# Patient Record
Sex: Female | Born: 1995 | Race: Black or African American | Hispanic: No | Marital: Single | Smoking: Current every day smoker
Health system: Southern US, Community
[De-identification: ages and names within clinical notes are randomized; demographics above are authoritative.]

## PROBLEM LIST (undated history)

## (undated) DIAGNOSIS — K219 Gastro-esophageal reflux disease without esophagitis: Secondary | ICD-10-CM

## (undated) DIAGNOSIS — I959 Hypotension, unspecified: Secondary | ICD-10-CM

## (undated) DIAGNOSIS — N83209 Unspecified ovarian cyst, unspecified side: Secondary | ICD-10-CM

## (undated) HISTORY — DX: Hypotension, unspecified: I95.9

---
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
## 2013-05-30 ENCOUNTER — Ambulatory Visit: Payer: Self-pay | Admitting: Family Medicine

## 2013-06-10 ENCOUNTER — Emergency Department: Payer: Self-pay | Admitting: Emergency Medicine

## 2013-06-10 LAB — URINALYSIS, COMPLETE
BILIRUBIN, UR: NEGATIVE
Bacteria: NONE SEEN
Blood: NEGATIVE
Glucose,UR: NEGATIVE mg/dL (ref 0–75)
Ketone: NEGATIVE
Leukocyte Esterase: NEGATIVE
Nitrite: NEGATIVE
PROTEIN: NEGATIVE
Ph: 6 (ref 4.5–8.0)
RBC,UR: 1 /HPF (ref 0–5)
Specific Gravity: 1.025 (ref 1.003–1.030)

## 2014-09-08 ENCOUNTER — Emergency Department
Admission: EM | Admit: 2014-09-08 | Discharge: 2014-09-08 | Disposition: A | Discharge status: Home / Self Care | Payer: Self-pay | Attending: Emergency Medicine | Admitting: Emergency Medicine

## 2014-09-08 DIAGNOSIS — Z3202 Encounter for pregnancy test, result negative: Secondary | ICD-10-CM | POA: Insufficient documentation

## 2014-09-08 DIAGNOSIS — Z720 Tobacco use: Secondary | ICD-10-CM | POA: Insufficient documentation

## 2014-09-08 DIAGNOSIS — K2970 Gastritis, unspecified, without bleeding: Secondary | ICD-10-CM | POA: Insufficient documentation

## 2014-09-08 LAB — URINALYSIS COMPLETE WITH MICROSCOPIC (ARMC ONLY)
BILIRUBIN URINE: NEGATIVE
GLUCOSE, UA: NEGATIVE mg/dL
HGB URINE DIPSTICK: NEGATIVE
Ketones, ur: NEGATIVE mg/dL
LEUKOCYTES UA: NEGATIVE
Nitrite: NEGATIVE
PROTEIN: NEGATIVE mg/dL
RBC / HPF: NONE SEEN RBC/hpf (ref 0–5)
Specific Gravity, Urine: 1.017 (ref 1.005–1.030)
pH: 8 (ref 5.0–8.0)

## 2014-09-08 LAB — POCT PREGNANCY, URINE: Preg Test, Ur: NEGATIVE

## 2014-09-08 MED ORDER — ONDANSETRON 4 MG PO TBDP
ORAL_TABLET | ORAL | Status: AC
  Administered 2014-09-08: 8 mg via ORAL
  Filled 2014-09-08: qty 2

## 2014-09-08 MED ORDER — GI COCKTAIL ~~LOC~~
30.0000 mL | ORAL | Status: AC
  Administered 2014-09-08: 30 mL via ORAL

## 2014-09-08 MED ORDER — RANITIDINE HCL 150 MG PO CAPS: 150.0000 mg | ORAL_CAPSULE | Freq: Two times a day (BID) | ORAL | Status: DC

## 2014-09-08 MED ORDER — SUCRALFATE 1 G PO TABS: 1.0000 g | ORAL_TABLET | Freq: Four times a day (QID) | ORAL | Status: DC

## 2014-09-08 MED ORDER — ONDANSETRON 4 MG PO TBDP
8.0000 mg | ORAL_TABLET | Freq: Once | ORAL | Status: AC
  Administered 2014-09-08: 8 mg via ORAL

## 2014-09-08 MED ORDER — GI COCKTAIL ~~LOC~~
ORAL | Status: AC
  Administered 2014-09-08: 30 mL via ORAL
  Filled 2014-09-08: qty 30

## 2014-09-08 NOTE — Discharge Instructions (Signed)

## 2014-09-08 NOTE — ED Notes (Signed)
Pt alert and oriented X4, active, cooperative, pt in NAD. RR even and unlabored, color WNL.  Pt informed to return if any life threatening symptoms occur.   

## 2014-09-08 NOTE — ED Notes (Signed)
EDP aware that pt does not want blood drawn. States that we do not need blood.

## 2014-09-08 NOTE — ED Provider Notes (Signed)
[facility] Emergency Department Provider Note  ____________________________________________  Time seen: 2:15 PM  I have reviewed the triage vital signs and the nursing notes.   HISTORY  Chief Complaint Abdominal Pain    HPI Dana Logan is a 19 y.o. female who complains of upper abdominal pain since last night. It is waxing and waning, and she is tolerating fluids. However, she notes that when she tries to eat bacon she gets stomach upset and vomits. Pain started around 11 PM last night. She does report a history of acid reflux but does not take any medications currently. No frequency or dysuria urgency or hematuria. Patient also reports some diarrhea, and her son is been sick with a viral illness for about a week. The pain is sharp nonradiating no aggravating or alleviating factors. No chest pain shortness of breath fever chills or headache.     History reviewed. No pertinent past medical history.  There are no active problems to display for this patient.   History reviewed. No pertinent past surgical history.  Current Outpatient Rx  Name  Route  Sig  Dispense  Refill  . ranitidine (ZANTAC) 150 MG capsule   Oral   Take 1 capsule (150 mg total) by mouth 2 (two) times daily.   28 capsule   0   . sucralfate (CARAFATE) 1 G tablet   Oral   Take 1 tablet (1 g total) by mouth 4 (four) times daily.   120 tablet   1     Allergies Review of patient's allergies indicates no known allergies.  No family history on file.  Social History History  Substance Use Topics  . Smoking status: Current Every Day Smoker  . Smokeless tobacco: Not on file  . Alcohol Use: Yes    Review of Systems  Constitutional: No fever or chills. No weight changes Eyes:No blurry vision or double vision.  ENT: No sore throat. Cardiovascular: No chest pain. Respiratory: No dyspnea or cough. Gastrointestinal: Upper abdominal pain with vomiting and diarrhea.  No BRBPR  or melena. Genitourinary: Negative for dysuria, urinary retention, bloody urine, or difficulty urinating. Musculoskeletal: Negative for back pain. No joint swelling or pain. Skin: Negative for rash. Neurological: Negative for headaches, focal weakness or numbness. Psychiatric:No anxiety or depression.   Endocrine:No hot/cold intolerance, changes in energy, or sleep difficulty.  10-point ROS otherwise negative.  ____________________________________________   PHYSICAL EXAM:  VITAL SIGNS: ED Triage Vitals  Enc Vitals Group     BP 09/08/14 1243 103/72 mmHg     Pulse Rate 09/08/14 1243 98     Resp 09/08/14 1405 16     Temp 09/08/14 1243 98 F (36.7 C)     Temp Source 09/08/14 1243 Oral     SpO2 09/08/14 1243 100 %     Weight 09/08/14 1243 125 lb (56.7 kg)     Height 09/08/14 1243 5' (1.524 m)     Head Cir --      Peak Flow --      Pain Score 09/08/14 1255 4     Pain Loc --      Pain Edu? --      Excl. in GC? --      Constitutional: Alert and oriented. Well appearing and in no distress. Eyes: No scleral icterus. No conjunctival pallor. PERRL. EOMI ENT   Head: Normocephalic and atraumatic.   Nose: No congestion/rhinnorhea. No septal hematoma   Mouth/Throat: MMM, no pharyngeal erythema. No peritonsillar mass. No uvula shift.  Neck: No stridor. No SubQ emphysema. No meningismus. Hematological/Lymphatic/Immunilogical: No cervical lymphadenopathy. Cardiovascular: RRR. Normal and symmetric distal pulses are present in all extremities. No murmurs, rubs, or gallops. Respiratory: Normal respiratory effort without tachypnea nor retractions. Breath sounds are clear and equal bilaterally. No wheezes/rales/rhonchi. Gastrointestinal: Soft, mild epigastric tenderness. No right upper quadrant or left right lower quadrant tenderness. No distention. There is no CVA tenderness.  No rebound, rigidity, or guarding. Genitourinary: deferred Musculoskeletal: Nontender with normal  range of motion in all extremities. No joint effusions.  No lower extremity tenderness.  No edema. Neurologic:   Normal speech and language.  CN 2-10 normal. Motor grossly intact. No pronator drift.  Normal gait. No gross focal neurologic deficits are appreciated.  Skin:  Skin is warm, dry and intact. No rash noted.  No petechiae, purpura, or bullae. Psychiatric: Mood and affect are normal. Speech and behavior are normal. Patient exhibits appropriate insight and judgment.  ____________________________________________    LABS (pertinent positives/negatives) (all labs ordered are listed, but only abnormal results are displayed) Labs Reviewed  URINALYSIS COMPLETEWITH MICROSCOPIC ([facility] ONLY) - Abnormal; Notable for the following:    Color, Urine YELLOW (*)    APPearance CLEAR (*)    Bacteria, UA RARE (*)    Squamous Epithelial / LPF 0-5 (*)    All other components within normal limits  COMPREHENSIVE METABOLIC PANEL  CBC WITH DIFFERENTIAL/PLATELET  LIPASE, BLOOD  POC URINE PREG, ED  POCT PREGNANCY, URINE   patient refused blood draw. Urinalysis and pregnancy test negative and normal. ____________________________________________   EKG    ____________________________________________    RADIOLOGY    ____________________________________________   PROCEDURES  ____________________________________________   INITIAL IMPRESSION / ASSESSMENT AND PLAN / ED COURSE  Pertinent labs & imaging results that were available during my care of the patient were reviewed by me and considered in my medical decision making (see chart for details).  No evidence of dehydration. Triage blood pressure likely erroneous as the patient has strong pulses in all extremities and appears well hydrated and has no orthostatic symptoms. Symptoms consistent with either viral syndrome or gastritis due to acid reflux. We'll give her Zofran and a GI cocktail and assess her symptoms. If she is able to drink  fluids and keep herself hydrated we can discharge her home.  ----------------------------------------- 3:11 PM on 09/08/2014 -----------------------------------------  Patient feeling much better, back to normal, tolerating juice by mouth. We'll discharge her home with Carafate and Zantac  ____________________________________________   FINAL CLINICAL IMPRESSION(S) / ED DIAGNOSES  Final diagnoses:  Gastritis      Sharman CheekPhillip Santos Sollenberger, MD 09/08/14 1511

## 2014-09-08 NOTE — ED Notes (Addendum)
Pt refuses blood work, ,states that she "does not give blood". Pt also inquiring about ultrasound to look for IUD, concerned it may be IUD causing her problems. IUD in for 3 years. Pt c/o upper abdominal pain cramping in nature that began this AM after diarrhea. Denies vaginal bleeding. Pt alert and oriented X4, active, cooperative, pt in NAD. RR even and unlabored, color WNL.  POC pregnancy negative per triage nurse.

## 2014-09-08 NOTE — ED Notes (Addendum)
Patient c/o aching upper abdominal pain. Started this am. Also experiencing n/v/d. Patient states that she ate cream of mushroom soup last night that had been sitting out in the crockpot. No one else that consumed the soup is experiencing these symptoms.

## 2014-09-08 NOTE — ED Notes (Signed)
Patient refusing to have blood drawn at this time.

## 2016-01-18 ENCOUNTER — Emergency Department
Admission: EM | Admit: 2016-01-18 | Discharge: 2016-01-18 | Disposition: A | Discharge status: Home / Self Care | Payer: Self-pay | Attending: Emergency Medicine | Admitting: Emergency Medicine

## 2016-01-18 ENCOUNTER — Encounter: Payer: Self-pay | Admitting: Emergency Medicine

## 2016-01-18 DIAGNOSIS — L0231 Cutaneous abscess of buttock: Secondary | ICD-10-CM | POA: Insufficient documentation

## 2016-01-18 DIAGNOSIS — F17200 Nicotine dependence, unspecified, uncomplicated: Secondary | ICD-10-CM | POA: Insufficient documentation

## 2016-01-18 MED ORDER — SULFAMETHOXAZOLE-TRIMETHOPRIM 800-160 MG PO TABS: 1.0000 | ORAL_TABLET | Freq: Two times a day (BID) | ORAL | 0 refills | Status: DC

## 2016-01-18 MED ORDER — ACETAMINOPHEN 500 MG PO TABS
1000.0000 mg | ORAL_TABLET | Freq: Once | ORAL | Status: AC
  Administered 2016-01-18: 1000 mg via ORAL
  Filled 2016-01-18: qty 2

## 2016-01-18 MED ORDER — TRAMADOL HCL 50 MG PO TABS: 50.0000 mg | ORAL_TABLET | Freq: Four times a day (QID) | ORAL | 0 refills | Status: AC | PRN

## 2016-01-18 MED ORDER — IBUPROFEN 800 MG PO TABS
800.0000 mg | ORAL_TABLET | Freq: Once | ORAL | Status: AC
  Administered 2016-01-18: 800 mg via ORAL
  Filled 2016-01-18: qty 1

## 2016-01-18 MED ORDER — LIDOCAINE HCL (PF) 1 % IJ SOLN
INTRAMUSCULAR | Status: AC
  Filled 2016-01-18: qty 5

## 2016-01-18 NOTE — ED Triage Notes (Signed)
Pt to ed with c/o abscess to perineal area x 3 days.

## 2016-01-18 NOTE — ED Notes (Signed)
Patient presents to the ED with a perineal abscess.  Patient reports shaving perineum recently and noticed swelling approx 3 days ago but pain is worse this morning.  Patient states, "I get bumps and boils sometimes, but they usually go away quickly, it's never been this bad."

## 2016-01-18 NOTE — ED Provider Notes (Signed)
[facility] Emergency Department Provider Note  ____________________________________________   First MD Initiated Contact with Patient 01/18/16 1005     (approximate)  I have reviewed the triage vital signs and the nursing notes.   HISTORY  Chief Complaint Abscess   HPI Dana Logan is a 20 y.o. female who presents with abscess in the perianal region x3 days. Patient shaves and thought that it may have been an ingrown hair, but got concerned when she couldn't get it to resolve at home. Patient complains of 10/10 pain that is constant. Notes that area has not drained anything despite her trying to get it to "come to a head". Patient denies fevers, abdominal pain, diarrhea, dysuria, vaginal discharge or vaginal bleeding.    History reviewed. No pertinent past medical history.  There are no active problems to display for this patient.   History reviewed. No pertinent surgical history.  Prior to Admission medications   Medication Sig Start Date End Date Taking? Authorizing Provider  ranitidine (ZANTAC) 150 MG capsule Take 1 capsule (150 mg total) by mouth 2 (two) times daily. 09/08/14   Sharman CheekPhillip Stafford, MD  sucralfate (CARAFATE) 1 G tablet Take 1 tablet (1 g total) by mouth 4 (four) times daily. 09/08/14   Sharman CheekPhillip Stafford, MD  sulfamethoxazole-trimethoprim (BACTRIM DS,SEPTRA DS) 800-160 MG tablet Take 1 tablet by mouth 2 (two) times daily. 01/18/16   Charmayne Sheerharles M Beers, PA-C  traMADol (ULTRAM) 50 MG tablet Take 1 tablet (50 mg total) by mouth every 6 (six) hours as needed. 01/18/16 01/17/17  Evangeline Dakinharles M Beers, PA-C    Allergies Review of patient's allergies indicates no known allergies.  History reviewed. No pertinent family history.  Social History Social History  Substance Use Topics  . Smoking status: Current Every Day Smoker  . Smokeless tobacco: Never Used  . Alcohol use Yes    Review of Systems Constitutional: No  fever/chills Gastrointestinal: No abdominal pain.  No nausea, no vomiting.  No diarrhea.  No constipation. Genitourinary: Negative for dysuria, vaginal discharge, vaginal bleeding. Musculoskeletal: Negative for back pain or pain in hips. Skin: Positive for "boil" on left buttocks. Neurological: Negative for headaches, focal weakness or numbness.  ____________________________________________   PHYSICAL EXAM:  VITAL SIGNS: ED Triage Vitals  Enc Vitals Group     BP 01/18/16 0915 100/67     Pulse Rate 01/18/16 0915 (!) 113     Resp 01/18/16 0915 18     Temp 01/18/16 0915 97.5 F (36.4 C)     Temp Source 01/18/16 0915 Oral     SpO2 01/18/16 0915 100 %     Weight 01/18/16 0911 120 lb (54.4 kg)     Height 01/18/16 0911 5' (1.524 m)     Head Circumference --      Peak Flow --      Pain Score 01/18/16 0911 10     Pain Loc --      Pain Edu? --      Excl. in GC? --     Constitutional: Alert and oriented. Well appearing and in no acute distress. Eyes: Conjunctivae are normal.  Head: Atraumatic. Neck: No stridor. Supple, full ROM without pain or difficulty. Cardiovascular: Normal rate, regular rhythm. Grossly normal heart sounds.  Good peripheral circulation. Respiratory: Normal respiratory effort.  No retractions. Lungs CTAB. Gastrointestinal: Soft and nontender. No distention. Musculoskeletal: Full ROM in all extremities without pain or difficulty. Neurologic:  Normal speech and language. No gross focal neurologic deficits are appreciated.  Skin:  Skin is warm, dry and intact. 4x3 cm area of induration and mild erythema with central fluctuance noted to left buttocks.  Psychiatric: Mood and affect are normal. Speech and behavior are normal.  ____________________________________________   LABS (all labs ordered are listed, but only abnormal results are displayed)  Labs Reviewed - No data to  display ____________________________________________  EKG  None. ____________________________________________  RADIOLOGY  None. ____________________________________________   PROCEDURES  Procedure(s) performed:   Marland KitchenMarland KitchenIncision and Drainage Date/Time: 01/18/2016 11:19 AM Performed by: Evangeline Dakin Authorized by: Evangeline Dakin   Consent:    Consent obtained:  Verbal   Consent given by:  Patient   Risks discussed:  Bleeding, infection and pain   Alternatives discussed:  No treatment Location:    Type:  Abscess   Location:  Anogenital   Anogenital location:  Perianal Pre-procedure details:    Skin preparation:  Betadine Anesthesia (see MAR for exact dosages):    Anesthesia method:  Local infiltration   Local anesthetic:  Lidocaine 1% w/o epi Procedure type:    Complexity:  Simple Procedure details:    Incision types:  Single straight   Scalpel blade:  11   Wound management:  Probed and deloculated   Drainage:  Purulent and bloody   Drainage amount:  Copious   Wound treatment:  Wound left open   Packing materials:  1/4 in iodoform gauze   Amount 1/4" iodoform:  8 in  Post-procedure details:    Patient tolerance of procedure:  Tolerated well, no immediate complications    Critical Care performed: No  ____________________________________________   INITIAL IMPRESSION / ASSESSMENT AND PLAN / ED COURSE  Pertinent labs & imaging results that were available during my care of the patient were reviewed by me and considered in my medical decision making (see chart for details).  Patient presented with abscess to left buttocks. Abscess incised and drained as described above. Given prescriptions for PO tramadol 50 mg #8 tabs with 0 refills and bactrim. Patient instructed to follow up in 48 hours for wound recheck. Given instructions on wound care. No other emergency medicine complaints at this time.   Clinical Course      ____________________________________________   FINAL CLINICAL IMPRESSION(S) / ED DIAGNOSES  Final diagnoses:  Abscess of left buttock      NEW MEDICATIONS STARTED DURING THIS VISIT:  New Prescriptions   SULFAMETHOXAZOLE-TRIMETHOPRIM (BACTRIM DS,SEPTRA DS) 800-160 MG TABLET    Take 1 tablet by mouth 2 (two) times daily.   TRAMADOL (ULTRAM) 50 MG TABLET    Take 1 tablet (50 mg total) by mouth every 6 (six) hours as needed.     Note:  This document was prepared using Dragon voice recognition software and may include unintentional dictation errors.   Evangeline Dakin, PA-C 01/18/16 1416    Jeanmarie Plant, MD 01/18/16 [phone]

## 2016-01-19 ENCOUNTER — Emergency Department: Admission: EM | Admit: 2016-01-19 | Discharge: 2016-01-19 | Discharge status: Against Medical Advice | Payer: Self-pay

## 2016-01-20 ENCOUNTER — Emergency Department
Admission: EM | Admit: 2016-01-20 | Discharge: 2016-01-20 | Disposition: A | Discharge status: Home / Self Care | Payer: Self-pay | Attending: Student in an Organized Health Care Education/Training Program | Admitting: Student in an Organized Health Care Education/Training Program

## 2016-01-20 ENCOUNTER — Encounter: Payer: Self-pay | Admitting: Emergency Medicine

## 2016-01-20 DIAGNOSIS — Z5189 Encounter for other specified aftercare: Secondary | ICD-10-CM

## 2016-01-20 DIAGNOSIS — Z4801 Encounter for change or removal of surgical wound dressing: Secondary | ICD-10-CM | POA: Insufficient documentation

## 2016-01-20 DIAGNOSIS — Z79899 Other long term (current) drug therapy: Secondary | ICD-10-CM | POA: Insufficient documentation

## 2016-01-20 DIAGNOSIS — F17200 Nicotine dependence, unspecified, uncomplicated: Secondary | ICD-10-CM | POA: Insufficient documentation

## 2016-01-20 NOTE — ED Provider Notes (Signed)
[facility] Emergency Department Provider Note  ____________________________________________   First MD Initiated Contact with Patient 01/20/16 [phone]     (approximate)  I have reviewed the triage vital signs and the nursing notes.   HISTORY  Chief Complaint Wound Check    HPI Dana Logan is a 20 y.o. female is here for packing removal. Patient was seen on 01/18/16 where an abscess was drained. Patient continues taking antibodies without any difficulty. Patient denies any severe worsening of her symptoms and has no knowledge of fever. Pain at this time is 0/10.   History reviewed. No pertinent past medical history.  There are no active problems to display for this patient.   History reviewed. No pertinent surgical history.  Prior to Admission medications   Medication Sig Start Date End Date Taking? Authorizing Provider  ranitidine (ZANTAC) 150 MG capsule Take 1 capsule (150 mg total) by mouth 2 (two) times daily. 09/08/14   Sharman Cheek, MD  sucralfate (CARAFATE) 1 G tablet Take 1 tablet (1 g total) by mouth 4 (four) times daily. 09/08/14   Sharman Cheek, MD  sulfamethoxazole-trimethoprim (BACTRIM DS,SEPTRA DS) 800-160 MG tablet Take 1 tablet by mouth 2 (two) times daily. 01/18/16   Charmayne Sheer Beers, PA-C  traMADol (ULTRAM) 50 MG tablet Take 1 tablet (50 mg total) by mouth every 6 (six) hours as needed. 01/18/16 01/17/17  Evangeline Dakin, PA-C    Allergies Review of patient's allergies indicates no known allergies.  No family history on file.  Social History Social History  Substance Use Topics  . Smoking status: Current Every Day Smoker  . Smokeless tobacco: Never Used  . Alcohol use Yes    Review of Systems Constitutional: No fever/chills Cardiovascular: Denies chest pain. Respiratory: Denies shortness of breath. Gastrointestinal:   No nausea, no vomiting.   Skin: Positive abscess. Neurological: Negative for headaches, focal  weakness or numbness.  10-point ROS otherwise negative.  ____________________________________________   PHYSICAL EXAM:  VITAL SIGNS: ED Triage Vitals  Enc Vitals Group     BP 01/20/16 0828 108/64     Pulse Rate 01/20/16 0828 94     Resp 01/20/16 0828 16     Temp 01/20/16 0828 98.2 F (36.8 C)     Temp Source 01/20/16 0828 Oral     SpO2 01/20/16 0828 97 %     Weight 01/20/16 0824 120 lb (54.4 kg)     Height 01/20/16 0824 5' (1.524 m)     Head Circumference --      Peak Flow --      Pain Score 01/20/16 0824 0     Pain Loc --      Pain Edu? --      Excl. in GC? --     Constitutional: Alert and oriented. Well appearing and in no acute distress. Eyes: Conjunctivae are normal. PERRL. EOMI. Head: Atraumatic. Nose: No congestion/rhinnorhea. Neck: No stridor.   Cardiovascular: Normal rate, regular rhythm. Grossly normal heart sounds.  Good peripheral circulation. Respiratory: Normal respiratory effort.  No retractions. Lungs CTAB. Musculoskeletal: Moves upper and lower extremities without any difficulty. Normal gait was noted. Neurologic:  Normal speech and language. No gross focal neurologic deficits are appreciated. No gait instability. Skin:  Healing abscess noted left buttocks area. Minimal drainage present on dressing. Dressing was removed without any difficulty. Psychiatric: Mood and affect are normal. Speech and behavior are normal.  ____________________________________________   LABS (all labs ordered are listed, but only abnormal results are  displayed)  Labs Reviewed - No data to display  PROCEDURES  Procedure(s) performed: None  Procedures  Critical Care performed: No  ____________________________________________   INITIAL IMPRESSION / ASSESSMENT AND PLAN / ED COURSE  Pertinent labs & imaging results that were available during my care of the patient were reviewed by me and considered in my medical decision making (see chart for details).    Clinical  Course   Patient [facility] any continued problems.  ____________________________________________   FINAL CLINICAL IMPRESSION(S) / ED DIAGNOSES  Final diagnoses:  Wound check, abscess      NEW MEDICATIONS STARTED DURING THIS VISIT:  New Prescriptions   No medications on file     Note:  This document was prepared using Dragon voice recognition software and may include unintentional dictation errors.    Tommi Rumpshonda L Summers, PA-C 01/20/16 [phone]    Willy EddyPatrick Robinson, MD 01/20/16 [phone]

## 2016-01-20 NOTE — ED Notes (Signed)
Pt had I&D on abscess on Thursday. Pt was told to come to the ED to have the gauze removed from wound. Pt stating that it is draining "thick puss." Wound is on posterior portion or LLE. Near gluteus fold.

## 2016-01-20 NOTE — ED Notes (Signed)
Pt's wound with dressed. Pt given extra 4x4s.

## 2016-01-20 NOTE — ED Triage Notes (Signed)
I+D of right gluteal abscess on Thursday.  Here today for packing removal

## 2016-01-20 NOTE — Discharge Instructions (Signed)
Sitz bath or warm moist compresses frequently today. Finishing antibiotic. [facility] if any continued problems.

## 2016-03-31 ENCOUNTER — Emergency Department
Admission: EM | Admit: 2016-03-31 | Discharge: 2016-04-01 | Disposition: A | Discharge status: Home / Self Care | Payer: Self-pay | Attending: Emergency Medicine | Admitting: Emergency Medicine

## 2016-03-31 ENCOUNTER — Emergency Department: Payer: Self-pay

## 2016-03-31 ENCOUNTER — Encounter: Payer: Self-pay | Admitting: Emergency Medicine

## 2016-03-31 DIAGNOSIS — F17200 Nicotine dependence, unspecified, uncomplicated: Secondary | ICD-10-CM | POA: Insufficient documentation

## 2016-03-31 DIAGNOSIS — R1012 Left upper quadrant pain: Secondary | ICD-10-CM

## 2016-03-31 DIAGNOSIS — K529 Noninfective gastroenteritis and colitis, unspecified: Secondary | ICD-10-CM | POA: Insufficient documentation

## 2016-03-31 DIAGNOSIS — R112 Nausea with vomiting, unspecified: Secondary | ICD-10-CM

## 2016-03-31 DIAGNOSIS — Z79899 Other long term (current) drug therapy: Secondary | ICD-10-CM | POA: Insufficient documentation

## 2016-03-31 HISTORY — DX: Gastro-esophageal reflux disease without esophagitis: K21.9

## 2016-03-31 LAB — COMPREHENSIVE METABOLIC PANEL
ALBUMIN: 4.4 g/dL (ref 3.5–5.0)
ALK PHOS: 72 U/L (ref 38–126)
ALT: 11 U/L — AB (ref 14–54)
AST: 18 U/L (ref 15–41)
Anion gap: 10 (ref 5–15)
BUN: 12 mg/dL (ref 6–20)
CALCIUM: 9.8 mg/dL (ref 8.9–10.3)
CHLORIDE: 103 mmol/L (ref 101–111)
CO2: 24 mmol/L (ref 22–32)
CREATININE: 0.79 mg/dL (ref 0.44–1.00)
GFR calc non Af Amer: >60 mL/min (ref >60)
GLUCOSE: 111 mg/dL — AB (ref 65–99)
Potassium: 3.2 mmol/L — ABNORMAL LOW (ref 3.5–5.1)
Sodium: 137 mmol/L (ref 135–145)
Total Bilirubin: 1.1 mg/dL (ref 0.3–1.2)
Total Protein: 9.2 g/dL — ABNORMAL HIGH (ref 6.5–8.1)

## 2016-03-31 LAB — URINALYSIS, COMPLETE (UACMP) WITH MICROSCOPIC
BILIRUBIN URINE: NEGATIVE
Bacteria, UA: NONE SEEN
Glucose, UA: NEGATIVE mg/dL
HGB URINE DIPSTICK: NEGATIVE
Ketones, ur: 5 mg/dL — AB
NITRITE: NEGATIVE
PROTEIN: 100 mg/dL — AB
Specific Gravity, Urine: 1.033 — ABNORMAL HIGH (ref 1.005–1.030)
pH: 5 (ref 5.0–8.0)

## 2016-03-31 LAB — CBC WITH DIFFERENTIAL/PLATELET
BASOS ABS: 0 10*3/uL (ref 0–0.1)
BASOS PCT: 0 %
EOS ABS: 0 10*3/uL (ref 0–0.7)
EOS PCT: 0 %
HCT: 42.5 % (ref 35.0–47.0)
HEMOGLOBIN: 14.3 g/dL (ref 12.0–16.0)
Lymphocytes Relative: 9 %
Lymphs Abs: 1.6 10*3/uL (ref 1.0–3.6)
MCH: 29.8 pg (ref 26.0–34.0)
MCHC: 33.6 g/dL (ref 32.0–36.0)
MCV: 88.8 fL (ref 80.0–100.0)
Monocytes Absolute: 0.8 10*3/uL (ref 0.2–0.9)
Monocytes Relative: 4 %
NEUTROS PCT: 87 %
Neutro Abs: 16.1 10*3/uL — ABNORMAL HIGH (ref 1.4–6.5)
PLATELETS: 238 10*3/uL (ref 150–440)
RBC: 4.79 MIL/uL (ref 3.80–5.20)
RDW: 13.4 % (ref 11.5–14.5)
WBC: 18.6 10*3/uL — AB (ref 3.6–11.0)

## 2016-03-31 LAB — POCT PREGNANCY, URINE: PREG TEST UR: NEGATIVE

## 2016-03-31 LAB — LIPASE, BLOOD: Lipase: 12 U/L (ref 11–51)

## 2016-03-31 MED ORDER — KETOROLAC TROMETHAMINE 30 MG/ML IJ SOLN
INTRAMUSCULAR | Status: AC
  Administered 2016-03-31: 30 mg via INTRAVENOUS
  Filled 2016-03-31: qty 1

## 2016-03-31 MED ORDER — KETOROLAC TROMETHAMINE 30 MG/ML IJ SOLN
30.0000 mg | Freq: Once | INTRAMUSCULAR | Status: AC
  Administered 2016-03-31: 30 mg via INTRAVENOUS

## 2016-03-31 MED ORDER — IOPAMIDOL (ISOVUE-300) INJECTION 61%
75.0000 mL | Freq: Once | INTRAVENOUS | Status: AC | PRN
  Administered 2016-03-31: 75 mL via INTRAVENOUS
  Filled 2016-03-31: qty 75

## 2016-03-31 MED ORDER — IOPAMIDOL (ISOVUE-300) INJECTION 61%
15.0000 mL | INTRAVENOUS | Status: AC
  Administered 2016-03-31: 15 mL via ORAL
  Filled 2016-03-31 (×2): qty 15

## 2016-03-31 MED ORDER — ONDANSETRON HCL 4 MG/2ML IJ SOLN
INTRAMUSCULAR | Status: AC
  Filled 2016-03-31: qty 2

## 2016-03-31 MED ORDER — ONDANSETRON HCL 4 MG/2ML IJ SOLN
4.0000 mg | Freq: Once | INTRAMUSCULAR | Status: AC
  Administered 2016-03-31: 4 mg via INTRAVENOUS

## 2016-03-31 MED ORDER — SODIUM CHLORIDE 0.9 % IV BOLUS (SEPSIS)
1000.0000 mL | Freq: Once | INTRAVENOUS | Status: AC
  Administered 2016-03-31: 1000 mL via INTRAVENOUS

## 2016-03-31 NOTE — ED Triage Notes (Signed)
abd pain and vomiting since yesterday. States also hot and cold chills

## 2016-03-31 NOTE — ED Provider Notes (Signed)
[facility] Emergency Department Provider Note  ____________________________________________  Time seen: Approximately 8:57 PM  I have reviewed the triage vital signs and the nursing notes.   HISTORY  Chief Complaint Abdominal Pain   HPI Dana Logan is a 20 y.o. female history of GERD who presents for evaluation of epigastric abdominal pain and multiple episodes of nonbloody nonbilious emesis. Patient reports 2 days of constant sharp severe epigastric abdominal pain. The pain is nonradiating. She reports she has never had similar pain before. No prior abdominal surgeries. No diarrhea, no constipation, no dysuria, no hematuria, no fever or chills, no vaginal discharge. Patient thinks that the pain is from her IUD which has been placed 4 years ago. No chest pain or coughing, no shortness of breath. She denies melena, coffee-ground emesis, hematemesis, NSAID use. She reports that she drinks alcohol socially.  Past Medical History:  Diagnosis Date  . GERD (gastroesophageal reflux disease)     There are no active problems to display for this patient.   History reviewed. No pertinent surgical history.  Prior to Admission medications   Medication Sig Start Date End Date Taking? Authorizing Provider  ciprofloxacin (CIPRO) 500 MG tablet Take 1 tablet (500 mg total) by mouth 2 (two) times daily. 04/01/16   Irean HongJade J Sung, MD  dicyclomine (BENTYL) 20 MG tablet Take 1 tablet (20 mg total) by mouth every 6 (six) hours as needed. 04/01/16   Irean HongJade J Sung, MD  ondansetron (ZOFRAN ODT) 4 MG disintegrating tablet Take 1 tablet (4 mg total) by mouth every 8 (eight) hours as needed for nausea or vomiting. 04/01/16   Irean HongJade J Sung, MD  ranitidine (ZANTAC) 150 MG capsule Take 1 capsule (150 mg total) by mouth 2 (two) times daily. 09/08/14   Sharman CheekPhillip Stafford, MD  sucralfate (CARAFATE) 1 G tablet Take 1 tablet (1 g total) by mouth 4 (four) times daily. 09/08/14   Sharman CheekPhillip Stafford,  MD  sulfamethoxazole-trimethoprim (BACTRIM DS,SEPTRA DS) 800-160 MG tablet Take 1 tablet by mouth 2 (two) times daily. 01/18/16   Charmayne Sheerharles M Beers, PA-C  traMADol (ULTRAM) 50 MG tablet Take 1 tablet (50 mg total) by mouth every 6 (six) hours as needed. 01/18/16 01/17/17  Evangeline Dakinharles M Beers, PA-C    Allergies Patient has no known allergies.  History reviewed. No pertinent family history.  Social History Social History  Substance Use Topics  . Smoking status: Current Every Day Smoker  . Smokeless tobacco: Never Used  . Alcohol use Yes    Review of Systems  Constitutional: Negative for fever. Eyes: Negative for visual changes. ENT: Negative for sore throat. Neck: No neck pain  Cardiovascular: Negative for chest pain. Respiratory: Negative for shortness of breath. Gastrointestinal: +  abdominal pain, vomiting. No diarrhea. Genitourinary: Negative for dysuria. Musculoskeletal: Negative for back pain. Skin: Negative for rash. Neurological: Negative for headaches, weakness or numbness. Psych: No SI or HI  ____________________________________________   PHYSICAL EXAM:  VITAL SIGNS: ED Triage Vitals  Enc Vitals Group     BP 03/31/16 1955 (!) 115/53     Pulse Rate 03/31/16 1955 (!) 112     Resp 03/31/16 1955 16     Temp 03/31/16 1955 98.1 F (36.7 C)     Temp src --      SpO2 03/31/16 1955 100 %     Weight 03/31/16 1956 115 lb (52.2 kg)     Height 03/31/16 1956 5' (1.524 m)     Head Circumference --  Peak Flow --      Pain Score 03/31/16 1956 7     Pain Loc --      Pain Edu? --      Excl. in GC? --     Constitutional: Alert and oriented. Well appearing and in no apparent distress. HEENT:      Head: Normocephalic and atraumatic.         Eyes: Conjunctivae are normal. Sclera is non-icteric. EOMI. PERRL      Mouth/Throat: Mucous membranes are moist.       Neck: Supple with no signs of meningismus. Cardiovascular: Regular rate and rhythm. No murmurs, gallops, or rubs. 2+  symmetrical distal pulses are present in all extremities. No JVD. Respiratory: Normal respiratory effort. Lungs are clear to auscultation bilaterally. No wheezes, crackles, or rhonchi.  Gastrointestinal: Soft, ttp over the epigastric and LUQ, and non distended with positive bowel sounds. No rebound or guarding. Genitourinary: No CVA tenderness. Musculoskeletal: Nontender with normal range of motion in all extremities. No edema, cyanosis, or erythema of extremities. Neurologic: Normal speech and language. Face is symmetric. Moving all extremities. No gross focal neurologic deficits are appreciated. Skin: Skin is warm, dry and intact. No rash noted. Psychiatric: Mood and affect are normal. Speech and behavior are normal.  ____________________________________________   LABS (all labs ordered are listed, but only abnormal results are displayed)  Labs Reviewed  CBC WITH DIFFERENTIAL/PLATELET - Abnormal; Notable for the following:       Result Value   WBC 18.6 (*)    Neutro Abs 16.1 (*)    All other components within normal limits  COMPREHENSIVE METABOLIC PANEL - Abnormal; Notable for the following:    Potassium 3.2 (*)    Glucose, Bld 111 (*)    Total Protein 9.2 (*)    ALT 11 (*)    All other components within normal limits  URINALYSIS, COMPLETE (UACMP) WITH MICROSCOPIC - Abnormal; Notable for the following:    Color, Urine AMBER (*)    APPearance CLEAR (*)    Specific Gravity, Urine 1.033 (*)    Ketones, ur 5 (*)    Protein, ur 100 (*)    Leukocytes, UA SMALL (*)    Squamous Epithelial / LPF 0-5 (*)    All other components within normal limits  LIPASE, BLOOD  POCT PREGNANCY, URINE   ____________________________________________  EKG  none  ____________________________________________  RADIOLOGY  CT a/p: PND ____________________________________________   PROCEDURES  Procedure(s) performed: None Procedures Critical Care performed:   None ____________________________________________   INITIAL IMPRESSION / ASSESSMENT AND PLAN / ED COURSE  20 y.o. female history of GERD who presents for evaluation of epigastric abdominal pain and multiple episodes of nonbloody nonbilious emesis. Patient is well-appearing, in no distress, she is tachycardic but otherwise has normal vital signs, patient is tender to palpation in the epigastric and left upper quadrants. Her blood work shows normal lipase, normal CMP, leukocytosis with white count of 18.6. CT abdomen and pelvis is pending. Urinalysis with no evidence of urinary tract infection. Pregnancy is negative. Care transferred to dr. Dolores Frame at 11PM with CT pending  Clinical Course     Pertinent labs & imaging results that were available during my care of the patient were reviewed by me and considered in my medical decision making (see chart for details).    ____________________________________________   FINAL CLINICAL IMPRESSION(S) / ED DIAGNOSES  Final diagnoses:  Left upper quadrant pain  Non-intractable vomiting with nausea, unspecified vomiting type  Enterocolitis  NEW MEDICATIONS STARTED DURING THIS VISIT:  Discharge Medication List as of 04/01/2016 12:36 AM    START taking these medications   Details  ciprofloxacin (CIPRO) 500 MG tablet Take 1 tablet (500 mg total) by mouth 2 (two) times daily., Starting Mon 04/01/2016, Print    dicyclomine (BENTYL) 20 MG tablet Take 1 tablet (20 mg total) by mouth every 6 (six) hours as needed., Starting Mon 04/01/2016, Print    ondansetron (ZOFRAN ODT) 4 MG disintegrating tablet Take 1 tablet (4 mg total) by mouth every 8 (eight) hours as needed for nausea or vomiting., Starting Mon 04/01/2016, Print         Note:  This document was prepared using Dragon voice recognition software and may include unintentional dictation errors.    Nita Sicklearolina Leanna Hamid, MD 04/01/16 [phone]

## 2016-03-31 NOTE — ED Notes (Signed)
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 Declined having her blood drawn in triage. Pt alert, moist mucous membranes, in nad.

## 2016-03-31 NOTE — ED Notes (Signed)
See triage note..the patient c/o abd pain and vomiting.  Sts that pain worse mid abd, pt expresses concern that it is her IUD.  Pt denies fever, CP, SOB, dizziness or LOC.

## 2016-04-01 MED ORDER — CIPROFLOXACIN HCL 500 MG PO TABS
500.0000 mg | ORAL_TABLET | Freq: Once | ORAL | Status: AC
  Administered 2016-04-01: 500 mg via ORAL
  Filled 2016-04-01: qty 1

## 2016-04-01 MED ORDER — CIPROFLOXACIN HCL 500 MG PO TABS: 500.0000 mg | ORAL_TABLET | Freq: Two times a day (BID) | ORAL | 0 refills | Status: DC

## 2016-04-01 MED ORDER — DICYCLOMINE HCL 20 MG PO TABS: 20.0000 mg | ORAL_TABLET | Freq: Four times a day (QID) | ORAL | 0 refills | Status: DC | PRN

## 2016-04-01 MED ORDER — ONDANSETRON 4 MG PO TBDP: 4.0000 mg | ORAL_TABLET | Freq: Three times a day (TID) | ORAL | 0 refills | Status: DC | PRN

## 2016-04-01 NOTE — Discharge Instructions (Signed)
1. You may take medicines as needed for abdominal cramping and nausea (Bentyl/Zofran #20). 2. Take antibiotic as prescribed (Cipro 500 mg twice daily 5 days). 3. Clear liquids 12 hours, then BRAT diet 3 days, then slowly advance diet as tolerated. 4. Return to the ER for worsening symptoms, persistent vomiting, difficulty breathing or other concerns.

## 2016-04-01 NOTE — ED Provider Notes (Signed)
-----------------------------------------   12:29 AM on 04/01/2016 -----------------------------------------  CT abdomen and pelvis interpreted per Dr. Gwenyth Benderadparvar: Findings most compatible with enterocolitis. Correlation with  clinical exam and stool cultures recommended. No bowel obstruction.  Normal appendix.   Patient is feeling much better, walking around. Tolerated PO without emesis. Discussed with patient results of CT scan. Will place her on Cipro, Zofran and Bentyl as needed, and she will follow-up with her PCP next week. Strict return precautions given. Patient verbalizes understanding and agrees with plan of care.   Irean HongJade J Sung, MD 04/01/16 [phone]

## 2017-08-19 ENCOUNTER — Emergency Department
Admission: EM | Admit: 2017-08-19 | Discharge: 2017-08-19 | Disposition: A | Discharge status: Home / Self Care | Payer: Self-pay | Attending: Emergency Medicine | Admitting: Emergency Medicine

## 2017-08-19 ENCOUNTER — Other Ambulatory Visit: Payer: Self-pay

## 2017-08-19 ENCOUNTER — Encounter: Payer: Self-pay | Admitting: Emergency Medicine

## 2017-08-19 DIAGNOSIS — L03116 Cellulitis of left lower limb: Secondary | ICD-10-CM | POA: Insufficient documentation

## 2017-08-19 DIAGNOSIS — F17200 Nicotine dependence, unspecified, uncomplicated: Secondary | ICD-10-CM | POA: Insufficient documentation

## 2017-08-19 LAB — POCT PREGNANCY, URINE: Preg Test, Ur: NEGATIVE

## 2017-08-19 MED ORDER — SULFAMETHOXAZOLE-TRIMETHOPRIM 800-160 MG PO TABS: 1.0000 | ORAL_TABLET | Freq: Two times a day (BID) | ORAL | 0 refills | Status: DC

## 2017-08-19 NOTE — Discharge Instructions (Addendum)
Follow-up with [facility] acute care or urgent care of your choice if any continued problems.  Begin using Bactrim DS twice daily for the next 10 days and warm moist compresses to your foot.  Elevate if needed for swelling.  Tylenol or ibuprofen as needed for pain.  You may also use Benadryl 1 or 2 capsules every 6 hours as needed for itching.

## 2017-08-19 NOTE — ED Triage Notes (Signed)
Pt arrives POV with c/o insect bite located on left foot. Pt noticed it last night. Pt states pain, burning and itching. Redness and slight swelling noted to top of foot. Pt does state allergy to mosquito bites.

## 2017-08-19 NOTE — ED Notes (Signed)
Pt has redness and swelling noted to top of left foot. Pt states pain with touch and that it hurts to put her shoes on.

## 2017-08-19 NOTE — ED Provider Notes (Signed)
[facility] Emergency Department Provider Note  ____________________________________________   First MD Initiated Contact with Patient 08/19/17 1432     (approximate)  I have reviewed the triage vital signs and the nursing notes.   HISTORY  Chief Complaint Insect Bite   HPI Dana Logan is a 22 y.o. female is here with complaint of a insect bite to her left foot that occurred last evening.  Patient states is very painful day with burning and itching.  She also noticed that there was some swelling with warmth to touch.  Patient has been unaware of any fever and denies chills.  Patient did not see what bit her and states that she is allergic to mosquito bites.  She rates her pain as 6 out of 10.   Past Medical History:  Diagnosis Date  . GERD (gastroesophageal reflux disease)     There are no active problems to display for this patient.   History reviewed. No pertinent surgical history.  Prior to Admission medications   Medication Sig Start Date End Date Taking? Authorizing Provider  ranitidine (ZANTAC) 150 MG capsule Take 1 capsule (150 mg total) by mouth 2 (two) times daily. 09/08/14   Sharman Cheek, MD  sulfamethoxazole-trimethoprim (BACTRIM DS,SEPTRA DS) 800-160 MG tablet Take 1 tablet by mouth 2 (two) times daily. 08/19/17   Tommi Rumps, PA-C    Allergies Patient has no known allergies.  No family history on file.  Social History Social History   Tobacco Use  . Smoking status: Current Every Day Smoker  . Smokeless tobacco: Never Used  Substance Use Topics  . Alcohol use: Yes  . Drug use: Not on file    Review of Systems Constitutional: No fever/chills Cardiovascular: Denies chest pain. Respiratory: Denies shortness of breath. Musculoskeletal: Left foot pain. Skin: Positive for erythema and pain. Neurological: Negative for headaches, focal weakness or numbness. ___________________________________________   PHYSICAL  EXAM:  VITAL SIGNS: ED Triage Vitals  Enc Vitals Group     BP 08/19/17 1415 (!) 91/49     Pulse Rate 08/19/17 1415 96     Resp 08/19/17 1415 18     Temp 08/19/17 1415 98.6 F (37 C)     Temp Source 08/19/17 1415 Oral     SpO2 08/19/17 1415 99 %     Weight 08/19/17 1416 130 lb (59 kg)     Height 08/19/17 1416  (1.448 m)     Head Circumference --      Peak Flow --      Pain Score 08/19/17 1415 6     Pain Loc --      Pain Edu? --      Excl. in GC? --    Constitutional: Alert and oriented. Well appearing and in no acute distress. Eyes: Conjunctivae are normal.  Head: Atraumatic. Neck: No stridor.   Cardiovascular: Normal rate, regular rhythm. Grossly normal heart sounds.  Good peripheral circulation. Respiratory: Normal respiratory effort.  No retractions. Lungs CTAB. Musculoskeletal: Examination of left foot dorsal aspect there is some soft tissue edema and warmth to touch.  No vesicles or open skin is noted.  No drainage present.  Motor sensory function intact distal to her insect bite. Neurologic:  Normal speech and language. No gross focal neurologic deficits are appreciated.  Skin:  Skin is warm, dry.  As noted above. Psychiatric: Mood and affect are normal. Speech and behavior are normal.  ____________________________________________   LABS (all labs ordered are listed, but  only abnormal results are displayed)  Labs Reviewed  POC URINE PREG, ED  POCT PREGNANCY, URINE     PROCEDURES  Procedure(s) performed: None  Procedures  Critical Care performed: No  ____________________________________________   INITIAL IMPRESSION / ASSESSMENT AND PLAN / ED COURSE Patient is here with complaint of pain and itching to her left foot after being bitten by a insect last evening.  Area is consistent with a cellulitis.  Patient was placed on Bactrim DS twice daily for 10 days after her urinalysis showed negative pregnancy.  Patient currently has a [employer] fast streamer.  She will use warm  compresses to the area and follow-up with her PCP or urgent care if any continued problems.  ____________________________________________   FINAL CLINICAL IMPRESSION(S) / ED DIAGNOSES  Final diagnoses:  Cellulitis of left foot     ED Discharge Orders        Ordered    sulfamethoxazole-trimethoprim (BACTRIM DS,SEPTRA DS) 800-160 MG tablet  2 times daily     08/19/17 1507       Note:  This document was prepared using Dragon voice recognition software and may include unintentional dictation errors.    Tommi Rumps, PA-C 08/19/17 1512    Jeanmarie Plant, MD 08/19/17 [phone]

## 2017-08-20 ENCOUNTER — Emergency Department
Admission: EM | Admit: 2017-08-20 | Discharge: 2017-08-20 | Disposition: A | Discharge status: Home / Self Care | Payer: Self-pay | Attending: Emergency Medicine | Admitting: Emergency Medicine

## 2017-08-20 ENCOUNTER — Other Ambulatory Visit: Payer: Self-pay

## 2017-08-20 ENCOUNTER — Encounter: Payer: Self-pay | Admitting: Emergency Medicine

## 2017-08-20 DIAGNOSIS — F17200 Nicotine dependence, unspecified, uncomplicated: Secondary | ICD-10-CM | POA: Insufficient documentation

## 2017-08-20 DIAGNOSIS — M79672 Pain in left foot: Secondary | ICD-10-CM | POA: Insufficient documentation

## 2017-08-20 DIAGNOSIS — Z79899 Other long term (current) drug therapy: Secondary | ICD-10-CM | POA: Insufficient documentation

## 2017-08-20 NOTE — ED Provider Notes (Signed)
[facility] Emergency Department Provider Note  ____________________________________________  Time seen: Approximately 8:30 AM  I have reviewed the triage vital signs and the nursing notes.   HISTORY  Chief Complaint Foot Pain   HPI Dana Logan is a 22 y.o. female who presents to the emergency department for treatment and evaluation of pain and swelling of the left foot. She was evaluated yesterday and states that the swelling has gone down, but she is unable to get her foot in her shoe and couldn't go to work today. She states she doesn't think that we need to change her medications, but just need a note for work.  Past Medical History:  Diagnosis Date  . GERD (gastroesophageal reflux disease)     There are no active problems to display for this patient.   History reviewed. No pertinent surgical history.  Prior to Admission medications   Medication Sig Start Date End Date Taking? Authorizing Provider  ranitidine (ZANTAC) 150 MG capsule Take 1 capsule (150 mg total) by mouth 2 (two) times daily. 09/08/14   Sharman Cheek, MD  sulfamethoxazole-trimethoprim (BACTRIM DS,SEPTRA DS) 800-160 MG tablet Take 1 tablet by mouth 2 (two) times daily. 08/19/17   Tommi Rumps, PA-C    Allergies Patient has no known allergies.  No family history on file.  Social History Social History   Tobacco Use  . Smoking status: Current Every Day Smoker  . Smokeless tobacco: Never Used  Substance Use Topics  . Alcohol use: Yes  . Drug use: Not on file    Review of Systems Constitutional: Negative for fever. ENT: Negative for sore throat. Respiratory: Negative for cough Gastrointestinal: No abdominal pain.  No nausea, no vomiting.  No diarrhea.  Musculoskeletal: Negative for generalized body aches. Skin: Negative for rash/lesion/wound.  Positive for mild erythema of the left foot Neurological: Negative for headaches, focal weakness or  numbness.  ____________________________________________   PHYSICAL EXAM:  VITAL SIGNS: ED Triage Vitals  Enc Vitals Group     BP 08/20/17 0808 113/66     Pulse Rate 08/20/17 0808 76     Resp 08/20/17 0808 16     Temp 08/20/17 0808 97.8 F (36.6 C)     Temp Source 08/20/17 0808 Oral     SpO2 08/20/17 0808 100 %     Weight 08/20/17 0809 130 lb (59 kg)     Height 08/20/17 0809 5' (1.524 m)     Head Circumference --      Peak Flow --      Pain Score 08/20/17 0808 7     Pain Loc --      Pain Edu? --      Excl. in GC? --     Constitutional: Alert and oriented. Well appearing and in no acute distress. Eyes: Conjunctivae are normal. PERRL. EOMI. Head: Atraumatic. Nose: No congestion/rhinnorhea. Mouth/Throat: Mucous membranes are moist. Neck: No stridor.  Cardiovascular: Normal rate, regular rhythm. Good peripheral circulation. Respiratory: Normal respiratory effort. Musculoskeletal: Full ROM throughout.  Neurologic:  Normal speech and language. No gross focal neurologic deficits are appreciated. Speech is normal. No gait instability. Skin:  Skin is warm, dry and intact. No rash noted. Psychiatric: Mood and affect are normal. Speech and behavior are normal.  ____________________________________________   LABS (all labs ordered are listed, but only abnormal results are displayed)  Labs Reviewed - No data to display ____________________________________________  EKG  Not indicated ____________________________________________  RADIOLOGY  Not indicated ____________________________________________   PROCEDURES  None ____________________________________________   INITIAL IMPRESSION / ASSESSMENT AND PLAN / ED COURSE     Pertinent labs & imaging results that were available during my care of the patient were reviewed by me and considered in my medical decision making (see chart for details).  Patient was advised to follow-up with her primary care provider for  symptoms that are not improving over the next few days.  She was advised to continue taking her Bactrim.  Work excuse was provided for 2 days.  She was encouraged to return to the emergency department if unable to schedule an appointment with her primary care provider and the symptoms are worsening.. ____________________________________________   FINAL CLINICAL IMPRESSION(S) / ED DIAGNOSES  Final diagnoses:  Foot pain, left       Chinita Pester, FNP 08/20/17 1552    Emily Filbert, MD 08/21/17 1114

## 2017-08-20 NOTE — ED Triage Notes (Signed)
Patient here complaining of increased swelling and pain in left foot.  Seen here yesterday and diagnosed with cellulitis after insect bite.  Returns this AM requesting extended work note.  Left foot reddened and swollen.  Wearing flip flops.

## 2017-08-20 NOTE — ED Notes (Signed)
See triage note  States she was seen yesterday and dx;d with cellulitis  Was placed on septra ds  States some of the swelling is gone  Area to lateral foot is red and painful  Ambulates with slight limp d/t pain

## 2017-10-08 ENCOUNTER — Other Ambulatory Visit: Payer: Self-pay

## 2017-10-08 ENCOUNTER — Emergency Department
Admission: EM | Admit: 2017-10-08 | Discharge: 2017-10-08 | Disposition: A | Discharge status: Home / Self Care | Payer: Self-pay | Attending: Emergency Medicine | Admitting: Emergency Medicine

## 2017-10-08 ENCOUNTER — Emergency Department: Payer: Self-pay

## 2017-10-08 ENCOUNTER — Encounter: Payer: Self-pay | Admitting: Emergency Medicine

## 2017-10-08 DIAGNOSIS — S20311A Abrasion of right front wall of thorax, initial encounter: Secondary | ICD-10-CM | POA: Insufficient documentation

## 2017-10-08 DIAGNOSIS — Y999 Unspecified external cause status: Secondary | ICD-10-CM | POA: Insufficient documentation

## 2017-10-08 DIAGNOSIS — F17210 Nicotine dependence, cigarettes, uncomplicated: Secondary | ICD-10-CM | POA: Insufficient documentation

## 2017-10-08 DIAGNOSIS — Y92511 Restaurant or cafe as the place of occurrence of the external cause: Secondary | ICD-10-CM | POA: Insufficient documentation

## 2017-10-08 DIAGNOSIS — Y9389 Activity, other specified: Secondary | ICD-10-CM | POA: Insufficient documentation

## 2017-10-08 DIAGNOSIS — W3400XA Accidental discharge from unspecified firearms or gun, initial encounter: Secondary | ICD-10-CM | POA: Insufficient documentation

## 2017-10-08 DIAGNOSIS — T148XXA Other injury of unspecified body region, initial encounter: Secondary | ICD-10-CM

## 2017-10-08 DIAGNOSIS — Z79899 Other long term (current) drug therapy: Secondary | ICD-10-CM | POA: Insufficient documentation
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
## 2017-10-08 NOTE — Discharge Instructions (Addendum)
Return to the ER if you have chest pain, shortness of breath, dizziness, abdominal pain, or any new symptoms concerning to you. Otherwise follow up with your doctor in a few days. Keep the abrasion area dry and clean, wash with warm water and soap.
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
## 2018-05-27 ENCOUNTER — Other Ambulatory Visit: Payer: Self-pay

## 2018-05-27 ENCOUNTER — Emergency Department
Admission: EM | Admit: 2018-05-27 | Discharge: 2018-05-27 | Disposition: A | Discharge status: Home / Self Care | Payer: Self-pay | Attending: Emergency Medicine | Admitting: Emergency Medicine

## 2018-05-27 ENCOUNTER — Encounter: Payer: Self-pay | Admitting: Emergency Medicine

## 2018-05-27 DIAGNOSIS — N760 Acute vaginitis: Secondary | ICD-10-CM | POA: Insufficient documentation

## 2018-05-27 DIAGNOSIS — F17210 Nicotine dependence, cigarettes, uncomplicated: Secondary | ICD-10-CM | POA: Insufficient documentation

## 2018-05-27 DIAGNOSIS — B9689 Other specified bacterial agents as the cause of diseases classified elsewhere: Secondary | ICD-10-CM | POA: Insufficient documentation

## 2018-05-27 LAB — URINALYSIS, COMPLETE (UACMP) WITH MICROSCOPIC
BILIRUBIN URINE: NEGATIVE
Glucose, UA: NEGATIVE mg/dL
Hgb urine dipstick: NEGATIVE
KETONES UR: 5 mg/dL — AB
Nitrite: NEGATIVE
PROTEIN: NEGATIVE mg/dL
Specific Gravity, Urine: 1.021 (ref 1.005–1.030)
pH: 6 (ref 5.0–8.0)

## 2018-05-27 LAB — WET PREP, GENITAL
Sperm: NONE SEEN
TRICH WET PREP: NONE SEEN

## 2018-05-27 MED ORDER — METRONIDAZOLE 500 MG PO TABS: 500.0000 mg | ORAL_TABLET | Freq: Two times a day (BID) | ORAL | 0 refills | Status: DC

## 2018-05-27 MED ORDER — SODIUM CHLORIDE 0.9% FLUSH: 3.0000 mL | Freq: Once | INTRAVENOUS | Status: DC

## 2018-05-27 NOTE — ED Notes (Signed)
Pt refused blood work  

## 2018-05-27 NOTE — ED Notes (Signed)
Pt states she is not interested in being treated for the abdominal pain, only wants to know if she has "a bad yeast infection."

## 2018-05-27 NOTE — Discharge Instructions (Signed)
Follow-up with your primary care provider or the health department.  Their contact information is listed on your discharge papers.  Begin taking Flagyl 500 mg until finished.  Again do not drink alcohol or take cough medications with alcohol in it while taking this medication.

## 2018-05-27 NOTE — ED Provider Notes (Signed)
[facility] Emergency Department Provider Note  ____________________________________________   First MD Initiated Contact with Patient 05/27/18 1328     (approximate)  I have reviewed the triage vital signs and the nursing notes.   HISTORY  Chief Complaint Abdominal Pain and Vaginal Discharge   HPI Dana Logan is a 23 y.o. female presents to the ED with concerns about a possible yeast infection.  Patient states that she was on a antibiotic related to an ovarian cyst and thinks now that she has a yeast infection from this.   Rates her discomfort as a 9 out of 10.   Past Medical History:  Diagnosis Date  . GERD (gastroesophageal reflux disease)     There are no active problems to display for this patient.   History reviewed. No pertinent surgical history.  Prior to Admission medications   Medication Sig Start Date End Date Taking? Authorizing Provider  metroNIDAZOLE (FLAGYL) 500 MG tablet Take 1 tablet (500 mg total) by mouth 2 (two) times daily. 05/27/18   Tommi Rumps, PA-C  ranitidine (ZANTAC) 150 MG capsule Take 1 capsule (150 mg total) by mouth 2 (two) times daily. 09/08/14   Sharman Cheek, MD    Allergies Patient has no known allergies.  History reviewed. No pertinent family history.  Social History Social History   Tobacco Use  . Smoking status: Current Every Day Smoker    Packs/day: 0.50    Types: Cigarettes  . Smokeless tobacco: Never Used  Substance Use Topics  . Alcohol use: Yes    Alcohol/week: 2.0 standard drinks    Types: 2 Shots of liquor per week    Comment: 2 glasses during the week, more on the weekends  . Drug use: Yes    Types: Marijuana    Comment: daily    Review of Systems Constitutional: No fever/chills Eyes: No visual changes. ENT: No sore throat. Cardiovascular: Denies chest pain. Gastrointestinal: No abdominal pain.  No nausea, no vomiting.  Genitourinary: Negative for dysuria.  Positive  for vaginal itching. Musculoskeletal: Negative for back pain. Skin: Negative for rash. Neurological: Negative for headaches, focal weakness or numbness. ____________________________________________   PHYSICAL EXAM:  VITAL SIGNS: ED Triage Vitals  Enc Vitals Group     BP 05/27/18 1202 (!) 94/52     Pulse Rate 05/27/18 1202 88     Resp 05/27/18 1202 18     Temp 05/27/18 1202 98.1 F (36.7 C)     Temp Source 05/27/18 1202 Oral     SpO2 05/27/18 1202 100 %     Weight 05/27/18 1203 120 lb (54.4 kg)     Height 05/27/18 1203 5' (1.524 m)     Head Circumference --      Peak Flow --      Pain Score 05/27/18 1203 9     Pain Loc --      Pain Edu? --      Excl. in GC? --    Constitutional: Alert and oriented. Well appearing and in no acute distress. Eyes: Conjunctivae are normal.  Head: Atraumatic. Neck: No stridor.   Cardiovascular: Normal rate, regular rhythm. Grossly normal heart sounds.  Good peripheral circulation. Respiratory: Normal respiratory effort.  No retractions. Lungs CTAB. Gastrointestinal: Soft and nontender. No distention.  Genitourinary: Yellow vaginal discharge is present with an odor.  No cervical motion tenderness was appreciated however patient was uncomfortable. Musculoskeletal: No lower extremity tenderness nor edema.  No joint effusions. Neurologic:  Normal speech and  language. No gross focal neurologic deficits are appreciated. No gait instability. Skin:  Skin is warm, dry and intact.  Psychiatric: Mood and affect are normal. Speech and behavior are normal.  ____________________________________________   LABS (all labs ordered are listed, but only abnormal results are displayed)  Labs Reviewed  WET PREP, GENITAL - Abnormal; Notable for the following components:      Result Value   Yeast Wet Prep HPF POC PRESENT (*)    Clue Cells Wet Prep HPF POC PRESENT (*)    WBC, Wet Prep HPF POC MANY (*)    All other components within normal limits  URINALYSIS,  COMPLETE (UACMP) WITH MICROSCOPIC - Abnormal; Notable for the following components:   Color, Urine YELLOW (*)    APPearance HAZY (*)    Ketones, ur 5 (*)    Leukocytes,Ua LARGE (*)    Bacteria, UA RARE (*)    All other components within normal limits  POC URINE PREG, ED    PROCEDURES  Procedure(s) performed: None  Procedures  Critical Care performed: No  ____________________________________________   INITIAL IMPRESSION / ASSESSMENT AND PLAN / ED COURSE  As part of my medical decision making, I reviewed the following data within the electronic MEDICAL RECORD NUMBER Notes from prior ED visits and [place] Controlled Substance Database  Patient presents to the ED with vaginal itching and is concerned that she has a yeast infection because she finished taking an antibiotic.  She states that she was seen by a doctor yesterday but apparently did not stay for the results.  Wet prep did show clue cells and patient was made aware that this is a bacterial vaginosis.  Patient was given a prescription for Flagyl 500 mg twice daily until finished and given information about the health department to follow-up with.  ____________________________________________   FINAL CLINICAL IMPRESSION(S) / ED DIAGNOSES  Final diagnoses:  BV (bacterial vaginosis)     ED Discharge Orders         Ordered    metroNIDAZOLE (FLAGYL) 500 MG tablet  2 times daily     05/27/18 1517           Note:  This document was prepared using Dragon voice recognition software and may include unintentional dictation errors.    Tommi RumpsSummers, Hakeen Shipes L, PA-C 05/27/18 1525    Minna AntisPaduchowski, Kevin, MD 05/27/18 1536

## 2018-05-27 NOTE — ED Triage Notes (Signed)
Pt presents via pov from home with increased vaginal discharge and abdominal pain. States that she recently finished course of medication for inflammation related to ovarian cyst and thinks the yeast infection is from antibiotics. Pt alert & oriented with NAD noted.

## 2018-08-17 ENCOUNTER — Other Ambulatory Visit: Payer: Self-pay

## 2018-08-17 ENCOUNTER — Emergency Department
Admission: EM | Admit: 2018-08-17 | Discharge: 2018-08-18 | Disposition: A | Discharge status: Home / Self Care | Payer: Self-pay | Attending: Emergency Medicine | Admitting: Emergency Medicine

## 2018-08-17 ENCOUNTER — Encounter: Payer: Self-pay | Admitting: Emergency Medicine

## 2018-08-17 DIAGNOSIS — R109 Unspecified abdominal pain: Secondary | ICD-10-CM | POA: Insufficient documentation

## 2018-08-17 DIAGNOSIS — Z5321 Procedure and treatment not carried out due to patient leaving prior to being seen by health care provider: Secondary | ICD-10-CM | POA: Insufficient documentation

## 2018-08-17 DIAGNOSIS — R1110 Vomiting, unspecified: Secondary | ICD-10-CM | POA: Insufficient documentation

## 2018-08-17 HISTORY — DX: Unspecified ovarian cyst, unspecified side: N83.209

## 2018-08-17 MED ORDER — ONDANSETRON 4 MG PO TBDP
4.0000 mg | ORAL_TABLET | Freq: Once | ORAL | Status: AC | PRN
  Administered 2018-08-17: 4 mg via ORAL
  Filled 2018-08-17: qty 1

## 2018-08-17 NOTE — ED Notes (Signed)
No answer when called several times from lobby 

## 2018-08-17 NOTE — ED Notes (Signed)
Pt noted leaving ED lobby 

## 2018-08-17 NOTE — ED Triage Notes (Signed)
Patient reports abdominal pain with nausea and vomiting x4 hours. Patient denies fever. Reports cold chills. Patient refusing blood work in triage.

## 2018-08-18 NOTE — ED Notes (Signed)
No answer when called several times from lobby 

## 2019-07-14 ENCOUNTER — Encounter: Payer: Self-pay | Admitting: Certified Nurse Midwife

## 2019-10-14 ENCOUNTER — Emergency Department: Payer: Self-pay

## 2019-10-14 ENCOUNTER — Other Ambulatory Visit: Payer: Self-pay

## 2019-10-14 ENCOUNTER — Encounter: Payer: Self-pay | Admitting: Emergency Medicine

## 2019-10-14 ENCOUNTER — Emergency Department
Admission: EM | Admit: 2019-10-14 | Discharge: 2019-10-14 | Disposition: A | Discharge status: Home / Self Care | Payer: Self-pay | Attending: Emergency Medicine | Admitting: Emergency Medicine

## 2019-10-14 DIAGNOSIS — F17210 Nicotine dependence, cigarettes, uncomplicated: Secondary | ICD-10-CM | POA: Insufficient documentation

## 2019-10-14 DIAGNOSIS — Y939 Activity, unspecified: Secondary | ICD-10-CM | POA: Insufficient documentation

## 2019-10-14 DIAGNOSIS — Y999 Unspecified external cause status: Secondary | ICD-10-CM | POA: Insufficient documentation

## 2019-10-14 DIAGNOSIS — Y9289 Other specified places as the place of occurrence of the external cause: Secondary | ICD-10-CM | POA: Insufficient documentation

## 2019-10-14 DIAGNOSIS — M79632 Pain in left forearm: Secondary | ICD-10-CM | POA: Insufficient documentation

## 2019-10-14 MED ORDER — BACLOFEN 5 MG PO TABS
5.0000 mg | ORAL_TABLET | Freq: Three times a day (TID) | ORAL | 0 refills | Status: DC | PRN
Start: 2019-10-14 — End: 2020-04-24

## 2019-10-14 NOTE — ED Notes (Signed)
Pt states that she needs to leave now to take her son to an appointment. This RN tells her we're waiting on her CT scans but that I would let [place], [place] know.

## 2019-10-14 NOTE — ED Triage Notes (Signed)
Pt states that her left arm hurts worse. Pt with abrasion to back. No LOC
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
## 2019-10-14 NOTE — Discharge Instructions (Signed)
Your head CT is normal.  Your neck CT shows that you are likely having some muscle spasms to your neck.  Your forearm x-ray is negative for a fracture but you may have some injury to the tendons in the ligaments.  Please follow-up with orthopedics for reevaluation.
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
## 2019-11-03 ENCOUNTER — Emergency Department
Admission: EM | Admit: 2019-11-03 | Discharge: 2019-11-03 | Disposition: A | Discharge status: Home / Self Care | Payer: Medicaid - Dental | Attending: Emergency Medicine | Admitting: Emergency Medicine

## 2019-11-03 ENCOUNTER — Other Ambulatory Visit: Payer: Self-pay

## 2019-11-03 DIAGNOSIS — S5012XD Contusion of left forearm, subsequent encounter: Secondary | ICD-10-CM | POA: Insufficient documentation

## 2019-11-03 DIAGNOSIS — Y9389 Activity, other specified: Secondary | ICD-10-CM | POA: Insufficient documentation

## 2019-11-03 DIAGNOSIS — Y999 Unspecified external cause status: Secondary | ICD-10-CM | POA: Insufficient documentation

## 2019-11-03 DIAGNOSIS — F17210 Nicotine dependence, cigarettes, uncomplicated: Secondary | ICD-10-CM | POA: Insufficient documentation

## 2019-11-03 DIAGNOSIS — Y9289 Other specified places as the place of occurrence of the external cause: Secondary | ICD-10-CM | POA: Insufficient documentation

## 2019-11-03 NOTE — Discharge Instructions (Signed)
Follow discharge care instruction were explained as needed.
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
## 2019-11-03 NOTE — ED Notes (Signed)
See triage note  Presents with con'd pain to left forearm  Stats she was wearing a sling and did have some relief  Dog ate the sling  denies any new injury

## 2019-11-03 NOTE — ED Triage Notes (Addendum)
Pt c/o of L arm pain. States was given a brace but dog ate the brace. States has not been to work since Monday d/t pain. States wearing the brace helps her pain but since she hasn't had the brace the pain has increased. Wants a new brace today. A&O, ambulatory.   Also c/o L ear pain. State "I don't know if I can't hear because of the drill at work but I would like my ear checked."

## 2019-11-12 ENCOUNTER — Emergency Department
Admission: EM | Admit: 2019-11-12 | Discharge: 2019-11-12 | Disposition: A | Discharge status: Home / Self Care | Payer: Medicaid - Dental | Attending: Emergency Medicine | Admitting: Emergency Medicine

## 2019-11-12 ENCOUNTER — Other Ambulatory Visit: Payer: Self-pay

## 2019-11-12 DIAGNOSIS — R42 Dizziness and giddiness: Secondary | ICD-10-CM | POA: Insufficient documentation

## 2019-11-12 DIAGNOSIS — F17210 Nicotine dependence, cigarettes, uncomplicated: Secondary | ICD-10-CM | POA: Insufficient documentation

## 2019-11-12 MED ORDER — SODIUM CHLORIDE 0.9% FLUSH: 3.0000 mL | Freq: Once | INTRAVENOUS | Status: DC

## 2019-11-12 NOTE — ED Provider Notes (Signed)
[facility] Emergency Department Provider Note  ____________________________________________  Time seen: Approximately 3:39 PM  I have reviewed the triage vital signs and the nursing notes.   HISTORY  Chief Complaint Weakness and Hypotension    HPI Dana Logan is a 24 y.o. female that presents to emergency department for evaluation of an episode of lightheadedness while at work today.  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  She splashed some water on her face and ate something and felt immediately better.  She did go to see the nurse and was told that her blood pressure was low and that she required a note to return to work.  Patient states that she does occasionally feel lightheaded when she gets overheated at work.  She had not had any breakfast this morning.  She also states that she did not have any water this morning.  She limits her salt intake because her family has a history of high blood pressure and she is worried that she might develop this.  She feels better now.  She does not want any lab work.   Past Medical History:  Diagnosis Date   GERD (gastroesophageal reflux disease)    Ovarian cyst     There are no problems to display for this patient.   History reviewed. No pertinent surgical history.  Prior to Admission medications   Medication Sig Start Date End Date Taking? Authorizing Provider  Baclofen 5 MG TABS Take 5 mg by mouth 3 (three) times daily as needed. 10/14/19   Enid Derry, PA-C    Allergies Patient has no known allergies.  History reviewed. No pertinent family history.  Social History Social History   Tobacco Use   Smoking status: Current Every Day Smoker    Packs/day: 0.50    Types: Cigarettes   Smokeless tobacco: Never Used  Substance Use Topics   Alcohol use: Yes    Alcohol/week: 2.0 standard drinks    Types: 2 Shots of  liquor per week    Comment: 2 glasses during the week, more on the weekends   Drug use: Yes    Types: Marijuana    Comment: daily     Review of Systems  Constitutional: No fever/chills ENT: No upper respiratory complaints. Cardiovascular: No chest pain. Respiratory: No cough. No SOB. Gastrointestinal: No abdominal pain.  No nausea, no vomiting.  Musculoskeletal: Negative for musculoskeletal pain. Skin: Negative for rash, abrasions, lacerations, ecchymosis. Neurological: Negative for headaches, numbness or tingling   ____________________________________________   PHYSICAL EXAM:  VITAL SIGNS: ED Triage Vitals  Enc Vitals Group     BP 11/12/19 1333 (!) 106/60     Pulse Rate 11/12/19 1333 80     Resp 11/12/19 1333 18     Temp 11/12/19 1333 98.1 F (36.7 C)     Temp Source 11/12/19 1333 Oral     SpO2 11/12/19 1333 100 %     Weight 11/12/19 1334 125 lb (56.7 kg)     Height 11/12/19 1334 4\' 9"  (1.448 m)     Head Circumference --      Peak Flow --      Pain Score 11/12/19 1334 0     Pain Loc --      Pain Edu? --      Excl. in GC? --      Constitutional: Alert and oriented. Well appearing and in no acute  distress. Eyes: Conjunctivae are normal. PERRL. EOMI. Head: Atraumatic. ENT:      Ears:      Nose: No congestion/rhinnorhea.      Mouth/Throat: Mucous membranes are moist.  Neck: No stridor.   Cardiovascular: Normal rate, regular rhythm.  Good peripheral circulation. Respiratory: Normal respiratory effort without tachypnea or retractions. Lungs CTAB. Good air entry to the bases with no decreased or absent breath sounds. Gastrointestinal: Bowel sounds 4 quadrants. Soft and nontender to palpation. No guarding or rigidity. No palpable masses. No distention. Musculoskeletal: Full range of motion to all extremities. No gross deformities appreciated. Neurologic:  Normal speech and language. No gross focal neurologic deficits are appreciated.  Skin:  Skin is warm, dry and  intact. No rash noted. Psychiatric: Mood and affect are normal. Speech and behavior are normal. Patient exhibits appropriate insight and judgement.   ____________________________________________   LABS (all labs ordered are listed, but only abnormal results are displayed)  Labs Reviewed  BASIC METABOLIC PANEL  CBC  URINALYSIS, COMPLETE (UACMP) WITH MICROSCOPIC  CBG MONITORING, ED  POC URINE PREG, ED   ____________________________________________  EKG   ____________________________________________  RADIOLOGY   No results found.  ____________________________________________    PROCEDURES  Procedure(s) performed:    Procedures    Medications  sodium chloride flush (NS) 0.9 % injection 3 mL (3 mLs Intravenous Not Given 11/12/19 1411)     ____________________________________________   INITIAL IMPRESSION / ASSESSMENT AND PLAN / ED COURSE  Pertinent labs & imaging results that were available during my care of the patient were reviewed by me and considered in my medical decision making (see chart for details).  Review of the [place] CSRS was performed in accordance of the [facility] prior to dispensing any controlled drugs.     Patient presents to emergency department for evaluation of episode of lightheadedness today.  Vital signs and exam are reassuring.  Patient's blood pressure is 106/60 on arriva; in the emergency department.  This is similar to her previous blood pressures during her previous ER visits.  Patient was encouraged to drink plenty of fluids including Gatorade while she is at work, as it is a hot July day. Patient declines labwork or IV fluids.  Patient feels back to normal now.  She will request a note to return to work.  Patient is to follow up with primary care as directed. Patient is given ED precautions to return to the ED for any worsening or new symptoms.   Dana Logan was evaluated in Emergency Department on 11/12/2019 for the symptoms described  in the history of present illness. She was evaluated in the context of the global COVID-19 pandemic, which necessitated consideration that the patient might be at risk for infection with the SARS-CoV-2 virus that causes COVID-19. Institutional protocols and algorithms that pertain to the evaluation of patients at risk for COVID-19 are in a state of rapid change based on information released by regulatory bodies including the CDC and federal and state organizations. These policies and algorithms were followed during the patient's care in the ED.  ____________________________________________  FINAL CLINICAL IMPRESSION(S) / ED DIAGNOSES  Final diagnoses:  Light headedness      NEW MEDICATIONS STARTED DURING THIS VISIT:  ED Discharge Orders    None          This chart was dictated using voice recognition software/Dragon. Despite best efforts to proofread, errors can occur which can change the meaning. Any change was purely unintentional.    Enid Derry,  PA-C 11/12/19 2303    Gilles Chiquito, MD 11/13/19 [phone]

## 2019-11-12 NOTE — ED Triage Notes (Signed)
Pt arrives via POV from work for reports of low BP. Pt reports she was at work at [employer] and started to feel dizzy and faint, was told her BP was low and to come to the ER. Pt ambulatory from triage with steady gait, pt states she ate something and has cooled off and feels much better. Skin warm and dry

## 2019-11-12 NOTE — ED Notes (Signed)
Pt refuses blood work, pt states "I feel much better".

## 2019-11-12 NOTE — ED Notes (Signed)
Patient states her job sent her home for blood pressure in the 90's and dizziness and cold chills. Patient states she needs a work note to go back.

## 2020-02-09 ENCOUNTER — Other Ambulatory Visit: Payer: Self-pay

## 2020-02-09 ENCOUNTER — Emergency Department
Admission: EM | Admit: 2020-02-09 | Discharge: 2020-02-09 | Disposition: A | Discharge status: Home / Self Care | Payer: Self-pay | Attending: Student in an Organized Health Care Education/Training Program | Admitting: Student in an Organized Health Care Education/Training Program

## 2020-02-09 ENCOUNTER — Encounter: Payer: Self-pay | Admitting: Emergency Medicine

## 2020-02-09 DIAGNOSIS — R109 Unspecified abdominal pain: Secondary | ICD-10-CM | POA: Insufficient documentation

## 2020-02-09 DIAGNOSIS — R112 Nausea with vomiting, unspecified: Secondary | ICD-10-CM | POA: Insufficient documentation

## 2020-02-09 DIAGNOSIS — Z5321 Procedure and treatment not carried out due to patient leaving prior to being seen by health care provider: Secondary | ICD-10-CM | POA: Insufficient documentation

## 2020-02-09 LAB — URINALYSIS, COMPLETE (UACMP) WITH MICROSCOPIC
Bacteria, UA: NONE SEEN
Bilirubin Urine: NEGATIVE
Glucose, UA: NEGATIVE mg/dL
Hgb urine dipstick: NEGATIVE
Ketones, ur: NEGATIVE mg/dL
Leukocytes,Ua: NEGATIVE
Nitrite: NEGATIVE
Protein, ur: NEGATIVE mg/dL
Specific Gravity, Urine: 1.023 (ref 1.005–1.030)
pH: 7 (ref 5.0–8.0)

## 2020-02-09 NOTE — ED Notes (Signed)
Pt refusing bloodwork at this time

## 2020-02-09 NOTE — ED Triage Notes (Signed)
Pt comes into the ED via POV c/o abdominal pain, N/V.  Pt states her biggest concern is that she wants to make sure she isn't pregnant.  Pt was vomiting at work and her job sent her here.  They completed a rapid COVID and it was negative.  Pt in NAD at this time with even and unlabored respirations.

## 2020-02-10 ENCOUNTER — Encounter: Payer: Self-pay | Admitting: Emergency Medicine

## 2020-02-10 ENCOUNTER — Emergency Department
Admission: EM | Admit: 2020-02-10 | Discharge: 2020-02-10 | Disposition: A | Discharge status: Home / Self Care | Payer: Self-pay | Attending: Emergency Medicine | Admitting: Emergency Medicine

## 2020-02-10 ENCOUNTER — Other Ambulatory Visit: Payer: Self-pay

## 2020-02-10 DIAGNOSIS — R1030 Lower abdominal pain, unspecified: Secondary | ICD-10-CM | POA: Insufficient documentation

## 2020-02-10 DIAGNOSIS — R42 Dizziness and giddiness: Secondary | ICD-10-CM | POA: Insufficient documentation

## 2020-02-10 DIAGNOSIS — K219 Gastro-esophageal reflux disease without esophagitis: Secondary | ICD-10-CM | POA: Insufficient documentation

## 2020-02-10 DIAGNOSIS — F17210 Nicotine dependence, cigarettes, uncomplicated: Secondary | ICD-10-CM | POA: Insufficient documentation

## 2020-02-10 DIAGNOSIS — R112 Nausea with vomiting, unspecified: Secondary | ICD-10-CM | POA: Insufficient documentation

## 2020-02-10 DIAGNOSIS — R55 Syncope and collapse: Secondary | ICD-10-CM | POA: Insufficient documentation

## 2020-02-10 LAB — URINALYSIS, COMPLETE (UACMP) WITH MICROSCOPIC
Bacteria, UA: NONE SEEN
Bilirubin Urine: NEGATIVE
Glucose, UA: NEGATIVE mg/dL
Hgb urine dipstick: NEGATIVE
Ketones, ur: NEGATIVE mg/dL
Leukocytes,Ua: NEGATIVE
Nitrite: NEGATIVE
Protein, ur: NEGATIVE mg/dL
Specific Gravity, Urine: 1.018 (ref 1.005–1.030)
pH: 6 (ref 5.0–8.0)

## 2020-02-10 LAB — LIPASE, BLOOD: Lipase: 30 U/L (ref 11–51)

## 2020-02-10 LAB — POC URINE PREG, ED: Preg Test, Ur: NEGATIVE

## 2020-02-10 LAB — CBC
HCT: 40.2 % (ref 36.0–46.0)
Hemoglobin: 13.6 g/dL (ref 12.0–15.0)
MCH: 29.6 pg (ref 26.0–34.0)
MCHC: 33.8 g/dL (ref 30.0–36.0)
MCV: 87.6 fL (ref 80.0–100.0)
Platelets: 309 10*3/uL (ref 150–400)
RBC: 4.59 MIL/uL (ref 3.87–5.11)
RDW: 12.3 % (ref 11.5–15.5)
WBC: 7.8 10*3/uL (ref 4.0–10.5)
nRBC: 0 % (ref 0.0–0.2)

## 2020-02-10 LAB — COMPREHENSIVE METABOLIC PANEL
ALT: 20 U/L (ref 0–44)
AST: 23 U/L (ref 15–41)
Albumin: 4.2 g/dL (ref 3.5–5.0)
Alkaline Phosphatase: 67 U/L (ref 38–126)
Anion gap: 9 (ref 5–15)
BUN: 10 mg/dL (ref 6–20)
CO2: 24 mmol/L (ref 22–32)
Calcium: 9.5 mg/dL (ref 8.9–10.3)
Chloride: 104 mmol/L (ref 98–111)
Creatinine, Ser: 0.74 mg/dL (ref 0.44–1.00)
GFR, Estimated: >60 mL/min (ref >60)
Glucose, Bld: 85 mg/dL (ref 70–99)
Potassium: 4.2 mmol/L (ref 3.5–5.1)
Sodium: 137 mmol/L (ref 135–145)
Total Bilirubin: 1 mg/dL (ref 0.3–1.2)
Total Protein: 8.2 g/dL — ABNORMAL HIGH (ref 6.5–8.1)

## 2020-02-10 LAB — TSH: TSH: 0.856 u[IU]/mL (ref 0.350–4.500)

## 2020-02-10 NOTE — Discharge Instructions (Signed)
Follow-up with your OB/GYN and primary doctors.  Return to the ER for new, worsening, or persistent severe abdominal pain, vomiting, fever, weakness, feeling like you are going to pass out, or any other new or worsening symptoms that concern you.

## 2020-02-10 NOTE — ED Provider Notes (Signed)
[facility] Emergency Department Provider Note ____________________________________________   First MD Initiated Contact with Patient 02/10/20 1218     (approximate)  I have reviewed the triage vital signs and the nursing notes.   HISTORY  Chief Complaint Abdominal Pain    HPI Dana Logan is a 24 y.o. female with PMH as noted below who presents with 2 primary complaints.  She has had nausea and vomiting since yesterday, somewhat subsided today, associated with midline lower abdominal pain which has been constant.  She denies associated diarrhea, fever or chills, urinary symptoms, or any new vaginal bleeding.  She has a Mirena IUD and reports intermittent spotting, but this is chronic.  She states that her son had similar symptoms yesterday and had to go home from school.  In addition, the patient reports chronic lightheadedness and near syncope, with a few episodes of full syncope over the last several months.  This mainly occurs when she is at work.  She syncopized yesterday after vomiting.  She reports a prodrome of lightheadedness and feeling sweaty.  She denies chest pain, palpitations, or shortness of breath.  She states that she has been drinking plenty of fluids, but usually does not eat breakfast or eat anything while she is at work.   Past Medical History:  Diagnosis Date  . GERD (gastroesophageal reflux disease)   . Ovarian cyst     There are no problems to display for this patient.   History reviewed. No pertinent surgical history.  Prior to Admission medications   Medication Sig Start Date End Date Taking? Authorizing Provider  Baclofen 5 MG TABS Take 5 mg by mouth 3 (three) times daily as needed. 10/14/19   Enid Derry, PA-C    Allergies Patient has no known allergies.  History reviewed. No pertinent family history.  Social History Social History   Tobacco Use  . Smoking status: Current Every Day Smoker    Packs/day: 0.50      Types: Cigarettes  . Smokeless tobacco: Never Used  Substance Use Topics  . Alcohol use: Yes    Alcohol/week: 2.0 standard drinks    Types: 2 Shots of liquor per week    Comment: 2 glasses during the week, more on the weekends  . Drug use: Yes    Types: Marijuana    Comment: daily    Review of Systems  Constitutional: No fever/chills. Eyes: No redness. ENT: No sore throat. Cardiovascular: Denies chest pain. Respiratory: Denies shortness of breath. Gastrointestinal: Positive for nausea and vomiting. Genitourinary: Negative for dysuria.  Musculoskeletal: Negative for back pain. Skin: Negative for rash. Neurological: Negative for headache.   ____________________________________________   PHYSICAL EXAM:  VITAL SIGNS: ED Triage Vitals  Enc Vitals Group     BP 02/10/20 1147 (!) 108/54     Pulse Rate 02/10/20 1147 90     Resp 02/10/20 1147 16     Temp 02/10/20 1147 98.7 F (37.1 C)     Temp Source 02/10/20 1147 Oral     SpO2 02/10/20 1147 100 %     Weight 02/10/20 1145 125 lb (56.7 kg)     Height 02/10/20 1145 4\' 9"  (1.448 m)     Head Circumference --      Peak Flow --      Pain Score 02/10/20 1145 6     Pain Loc --      Pain Edu? --      Excl. in GC? --     Constitutional:  Alert and oriented. Well appearing and in no acute distress. Eyes: Conjunctivae are normal.  Head: Atraumatic. Nose: No congestion/rhinnorhea. Mouth/Throat: Mucous membranes are moist.   Neck: Normal range of motion.  Cardiovascular: Normal rate, regular rhythm.   Good peripheral circulation. Respiratory: Normal respiratory effort.  No retractions. Gastrointestinal: Soft and nontender. No distention.  Genitourinary: No flank tenderness. Musculoskeletal: Extremities warm and well perfused.  Neurologic:  Normal speech and language. No gross focal neurologic deficits are appreciated.  Skin:  Skin is warm and dry. No rash noted. Psychiatric: Mood and affect are normal. Speech and behavior  are normal.  ____________________________________________   LABS (all labs ordered are listed, but only abnormal results are displayed)  Labs Reviewed  COMPREHENSIVE METABOLIC PANEL - Abnormal; Notable for the following components:      Result Value   Total Protein 8.2 (*)    All other components within normal limits  URINALYSIS, COMPLETE (UACMP) WITH MICROSCOPIC - Abnormal; Notable for the following components:   Color, Urine YELLOW (*)    APPearance HAZY (*)    All other components within normal limits  LIPASE, BLOOD  CBC  TSH  POC URINE PREG, ED   ____________________________________________  EKG  ED ECG REPORT I, Dionne Bucy, the attending physician, personally viewed and interpreted this ECG.  Date: 02/10/2020 EKG Time: 1243 Rate: 69 Rhythm: normal sinus rhythm QRS Axis: normal Intervals: normal ST/T Wave abnormalities: normal Narrative Interpretation: no evidence of acute ischemia  ____________________________________________  RADIOLOGY    ____________________________________________   PROCEDURES  Procedure(s) performed: No  Procedures  Critical Care performed: No ____________________________________________   INITIAL IMPRESSION / ASSESSMENT AND PLAN / ED COURSE  Pertinent labs & imaging results that were available during my care of the patient were reviewed by me and considered in my medical decision making (see chart for details).  24 year old female with PMH as noted above presents with nausea and vomiting since yesterday associated with some suprapubic area pain.  Her son had similar symptoms.  In addition, she has had intermittent lightheadedness and near syncope frequently over the last few months, mainly while at work, and associated with a syncopal episode yesterday.  She had a prodrome of lightheadedness.  On exam, the patient is well-appearing.  Her vital signs are normal.  The abdomen is soft with no focal tenderness.    I  reviewed the past medical records in Epic.  The patient was seen in the ED on 7/30 with an episode of lightheadedness that resolved after eating and drinking.  She declined work-up at that time.  The patient reports a strong phobia of needles and blood draws, and initially declined blood work.  However, I explained that would be beneficial particularly in terms of working up her lightheadedness which she is concerned about, so she agreed.  In terms of the nausea, vomiting, abdominal pain, this is most consistent with an acute gastroenteritis or foodborne illness especially given that her son had the same symptoms.  This appears to be improving and she is not actively vomiting now.  It is also possible that she could have some chronic etiology of pelvic pain such as endometriosis.  There is no evidence of ovarian cyst rupture, torsion, or other gynecologic emergency.  The abdomen is soft and nontender.  Differential for the lightheadedness and near syncope includes dehydration, anemia, electrolyte abnormalities, renal insufficiency, hyperglycemia/poor p.o. intake, or other benign etiology.  I have a low suspicion for cardiac cause.  We will obtain basic and hepatobiliary labs,  urinalysis, TSH, EKG, and reassess.  ----------------------------------------- 1:49 PM on 02/10/2020 -----------------------------------------  Lab work-up is all within normal limits.  The patient continues to appear comfortable with no significant active pain.  She is stable for discharge at this time.  She states she feels well to go home.  I counseled her on the results of the work-up.  Return precautions given, and she expresses understanding.  ____________________________________________   FINAL CLINICAL IMPRESSION(S) / ED DIAGNOSES  Final diagnoses:  Syncope, unspecified syncope type  Lower abdominal pain      NEW MEDICATIONS STARTED DURING THIS VISIT:  New Prescriptions   No medications on file      Note:  This document was prepared using Dragon voice recognition software and may include unintentional dictation errors.   Dionne Bucy, MD 02/10/20 1350

## 2020-02-10 NOTE — ED Notes (Signed)
Pt assessed resting in bed, no distress noted, Dr Marisa Severin at bedside, call light in reach.

## 2020-02-10 NOTE — ED Triage Notes (Signed)
Pt comes into the ED via POV c/o abd pain and wants to r/o pregnancy.  Pt was seen yesterday but left prior to being seen by provider.  Pt refuses any blood work to be completed at this time.  Pt still admits to having N/V. Pt has had merena in for 8 years and is concerned she may be pregnant since she has had it in longer than the 5 year recommendation.

## 2020-03-03 ENCOUNTER — Other Ambulatory Visit: Payer: Self-pay

## 2020-03-03 ENCOUNTER — Emergency Department
Admission: EM | Admit: 2020-03-03 | Discharge: 2020-03-03 | Disposition: A | Discharge status: Home / Self Care | Payer: Self-pay | Attending: Student in an Organized Health Care Education/Training Program | Admitting: Student in an Organized Health Care Education/Training Program

## 2020-03-03 DIAGNOSIS — F17210 Nicotine dependence, cigarettes, uncomplicated: Secondary | ICD-10-CM | POA: Insufficient documentation

## 2020-03-03 DIAGNOSIS — I951 Orthostatic hypotension: Secondary | ICD-10-CM | POA: Insufficient documentation

## 2020-03-03 DIAGNOSIS — R55 Syncope and collapse: Secondary | ICD-10-CM

## 2020-03-03 LAB — POC URINE PREG, ED
Preg Test, Ur: NEGATIVE
Preg Test, Ur: NEGATIVE

## 2020-03-03 NOTE — ED Notes (Signed)
See triage note.  Says she had blood work last time and they did not find anything.  Her job requires her to stand in one place.  She is alert and oriented. Skin warm and dry.

## 2020-03-03 NOTE — ED Provider Notes (Signed)
[facility] Emergency Department Provider Note   ____________________________________________   First MD Initiated Contact with Patient 03/03/20 1238     (approximate)  I have reviewed the triage vital signs and the nursing notes.   HISTORY  Chief Complaint Hypotension   HPI Dana Logan is a 24 y.o. female presents to the ED with complaint of chronic hypertension.  Patient states that she had a syncopal episode yesterday and was told by her company that she would need to be seen and evaluated by her PCP before she could come back to work and be safe.  Patient states that she is contacting a new primary doctor to set up an appointment but has been unable to see them.  Patient states that she was seen in the emergency department last month and that her blood work did not show anything and therefore refuses to have blood work done today.  She denies any new symptoms, injuries or sick contacts.  She denies any pain at this time.    Past Medical History:  Diagnosis Date  . GERD (gastroesophageal reflux disease)   . Ovarian cyst     There are no problems to display for this patient.   History reviewed. No pertinent surgical history.  Prior to Admission medications   Medication Sig Start Date End Date Taking? Authorizing Provider  Baclofen 5 MG TABS Take 5 mg by mouth 3 (three) times daily as needed. 10/14/19   Enid Derry, PA-C    Allergies Patient has no known allergies.  History reviewed. No pertinent family history.  Social History Social History   Tobacco Use  . Smoking status: Current Every Day Smoker    Packs/day: 0.50    Types: Cigarettes  . Smokeless tobacco: Never Used  Substance Use Topics  . Alcohol use: Yes    Alcohol/week: 2.0 standard drinks    Types: 2 Shots of liquor per week    Comment: 2 glasses during the week, more on the weekends  . Drug use: Yes    Types: Marijuana    Comment: daily    Review of  Systems Constitutional: No fever/chills.  Positive for dizziness. Eyes: No visual changes. ENT: No sore throat. Cardiovascular: Denies chest pain. Respiratory: Denies shortness of breath.  Negative for cough. Gastrointestinal: No abdominal pain.  No nausea, no vomiting.  No diarrhea.  No constipation. Genitourinary: Negative for dysuria. Musculoskeletal: Negative for muscle skeletal pain. Skin: Negative for rash. Neurological: Negative for headaches, focal weakness or numbness.   ____________________________________________   PHYSICAL EXAM:  VITAL SIGNS: ED Triage Vitals  Enc Vitals Group     BP 03/03/20 1129 (!) 110/50     Pulse Rate 03/03/20 1129 73     Resp 03/03/20 1129 16     Temp 03/03/20 1129 98.4 F (36.9 C)     Temp Source 03/03/20 1129 Oral     SpO2 03/03/20 1129 100 %     Weight 03/03/20 1131 125 lb (56.7 kg)     Height 03/03/20 1131 4\' 9"  (1.448 m)     Head Circumference --      Peak Flow --      Pain Score 03/03/20 1139 0     Pain Loc --      Pain Edu? --      Excl. in GC? --     Constitutional: Alert and oriented. Well appearing and in no acute distress. Eyes: Conjunctivae are normal. PERRL. EOMI. Head: Atraumatic. Nose: No congestion/rhinnorhea. Neck:  No stridor.   Cardiovascular: Normal rate, regular rhythm. Grossly normal heart sounds.  Good peripheral circulation. Respiratory: Normal respiratory effort.  No retractions. Lungs CTAB. Gastrointestinal: Soft and nontender. No distention.  Musculoskeletal: Moves upper and lower extremities they have difficulty and normal gait was noted while patient was in the ED. Neurologic:  Normal speech and language. No gross focal neurologic deficits are appreciated. No gait instability. Skin:  Skin is warm, dry and intact. No rash noted. Psychiatric: Mood and affect are normal. Speech and behavior are normal.  ____________________________________________   LABS (all labs ordered are listed, but only abnormal  results are displayed)  Labs Reviewed  POC URINE PREG, ED  POC URINE PREG, ED    PROCEDURES  Procedure(s) performed (including Critical Care):  Procedures   ____________________________________________   INITIAL IMPRESSION / ASSESSMENT AND PLAN / ED COURSE  As part of my medical decision making, I reviewed the following data within the electronic MEDICAL RECORD NUMBER Notes from prior ED visits and [place] Controlled Substance Database   Patient is very vocal and expressing her refusal stating that "ya'll could not find anything last time and I am not going to give any blood this time".  Orthostatics in the emergency department today are concerning and patient was made aware that when she stood her blood pressure did go down which may be causing her syncopal episodes however patient continued to refuse treatment and is aware that she is leaving AGAINST MEDICAL ADVICE.  She was given instructions to return to the emergency department if any urgent concerns.  Normal gait was noted as patient left the room and ambulated without any assistance.      ____________________________________________   FINAL CLINICAL IMPRESSION(S) / ED DIAGNOSES  Final diagnoses:  Syncope, unspecified syncope type  Orthostatic hypotension     ED Discharge Orders    None      *Please note:  Dana Logan was evaluated in Emergency Department on 03/03/2020 for the symptoms described in the history of present illness. She was evaluated in the context of the global COVID-19 pandemic, which necessitated consideration that the patient might be at risk for infection with the SARS-CoV-2 virus that causes COVID-19. Institutional protocols and algorithms that pertain to the evaluation of patients at risk for COVID-19 are in a state of rapid change based on information released by regulatory bodies including the CDC and federal and state organizations. These policies and algorithms were followed during the patient's  care in the ED.  Some ED evaluations and interventions may be delayed as a result of limited staffing during and the pandemic.*   Note:  This document was prepared using Dragon voice recognition software and may include unintentional dictation errors.    Tommi Rumps, PA-C 03/03/20 1640    Willy Eddy, MD 03/07/20 [phone]

## 2020-03-03 NOTE — ED Triage Notes (Signed)
Pt arrives via pov, pt reports frequent BP drops that has been occurring over last several months. Pt contacted new primary doctor to set up apt with them but has not been able to be seen there at this time. Pt reports that she is here to be seen because she is unable to go back to work without a note stating that she is medically safe. Pt refusing blood work this visit and states " they can look at the blood work from last time".  NAD noted at this time. BP WNL at this time.

## 2020-03-03 NOTE — Discharge Instructions (Addendum)
Follow-up with your primary care provider.  Be aware that you are leaving AGAINST MEDICAL ADVICE and that the cause for your syncopal episode yesterday has not been evaluated.  Return to the emergency department over the weekend if any continued problems or concerns however at that time you will still need a full work-up to evaluate why you are feeling like this.  We cannot give you a return to work note since you are leaving AGAINST MEDICAL ADVICE as we have no explanation for why you have had episodes of syncope or near syncopal episodes and hypotension.

## 2020-03-06 ENCOUNTER — Other Ambulatory Visit: Payer: Self-pay

## 2020-03-06 ENCOUNTER — Emergency Department
Admission: EM | Admit: 2020-03-06 | Discharge: 2020-03-06 | Disposition: A | Discharge status: Home / Self Care | Payer: Self-pay | Attending: Emergency Medicine | Admitting: Emergency Medicine

## 2020-03-06 ENCOUNTER — Encounter: Payer: Self-pay | Admitting: Emergency Medicine

## 2020-03-06 ENCOUNTER — Emergency Department: Payer: Self-pay

## 2020-03-06 DIAGNOSIS — I951 Orthostatic hypotension: Secondary | ICD-10-CM | POA: Insufficient documentation

## 2020-03-06 DIAGNOSIS — F17210 Nicotine dependence, cigarettes, uncomplicated: Secondary | ICD-10-CM | POA: Insufficient documentation

## 2020-03-06 NOTE — ED Triage Notes (Signed)
Pt via pov from work; states that she has been here in the past but that she refused blood work. Pt states she was sent from work because they say she is "a liability" if her bp is low. She states she will do the blood work today but that she will only allow one stick, so she will not allow blood to be drawn in triage if she may have to have an iv in the back. Pt aware this could delay the beginning of her care. She is ok with that. Pt alert & oriented, aware that her bp is WNL today.

## 2020-03-06 NOTE — ED Provider Notes (Signed)
[facility] Emergency Department Provider Note   ____________________________________________   First MD Initiated Contact with Patient 03/06/20 0930     (approximate)  I have reviewed the triage vital signs and the nursing notes.   HISTORY  Chief Complaint Hypotension    HPI Dana Logan is a 24 y.o. female here for evaluation of lightheadedness  Patient reports for years now anytime she stands still for a brief period she will become lightheaded.  She does not have any chest pain.  No fevers or chills.  No recent illness.  She reports that she was seen in the ER few days ago for the same, and her employer told her she needed a work note before they would allow her to return to work.  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  Denies pregnancy.  No chest pain no abdominal pain.  Denies heavy periods or bleeding.  She reports she stays healthy, eats lots of chicken, eats a well-balanced diet, and over the last several weeks she is work to incorporate breakfast and meals before going to work as well as staying hydrated and taking good breaks while she is at work  She has not passed out in the last few days.  She does continue to regular experience a feeling of lightheadedness if she stands still for long periods of time but this has been persistent for years   Past Medical History:  Diagnosis Date   GERD (gastroesophageal reflux disease)    Ovarian cyst     There are no problems to display for this patient.   History reviewed. No pertinent surgical history.  Prior to Admission medications   Medication Sig Start Date End Date Taking? Authorizing Provider  Baclofen 5 MG TABS Take 5 mg by mouth 3 (three) times daily as needed. 10/14/19   Enid Derry, PA-C    Allergies Patient has no known allergies.  History reviewed. No  pertinent family history.  Social History Social History   Tobacco Use   Smoking status: Current Every Day Smoker    Packs/day: 0.50    Types: Cigarettes   Smokeless tobacco: Never Used  Vaping Use   Vaping Use: Never used  Substance Use Topics   Alcohol use: Yes    Alcohol/week: 2.0 standard drinks    Types: 2 Shots of liquor per week    Comment: 2 glasses during the week, more on the weekends   Drug use: Yes    Types: Marijuana    Comment: daily    Review of Systems Constitutional: No fever/chills Eyes: No visual changes.  She does report though when she starts to get lightheaded she will start to see spots briefly but will go away once her lightheadedness improves Cardiovascular: Denies chest pain. Respiratory: Denies shortness of breath. Gastrointestinal: No abdominal pain.   Genitourinary: Negative for dysuria. Neurological: Negative for headaches no weakness.    ____________________________________________   PHYSICAL EXAM:  VITAL SIGNS: ED Triage Vitals  Enc Vitals Group     BP 03/06/20 0846 119/68     Pulse Rate 03/06/20 0846 77     Resp 03/06/20 0846 16     Temp 03/06/20 0846 98.3 F (36.8 C)     Temp Source 03/06/20 0846 Oral     SpO2 03/06/20 0846 99 %     Weight 03/06/20 0846  125 lb (56.7 kg)     Height 03/06/20 0846 4\' 9"  (1.448 m)     Head Circumference --      Peak Flow --      Pain Score 03/06/20 0854 0     Pain Loc --      Pain Edu? --      Excl. in GC? --     Constitutional: Alert and oriented. Well appearing and in no acute distress. Eyes: Conjunctivae are normal. Head: Atraumatic. Nose: No congestion/rhinnorhea. Mouth/Throat: Mucous membranes are moist. Neck: No stridor.  No bruits. Cardiovascular: Normal rate, regular rhythm. Grossly normal heart sounds.  Respiratory: Normal respiratory effort.  No retractions. Lungs CTAB. Gastrointestinal: Soft and nontender. No distention. Musculoskeletal: No lower extremity tenderness nor  edema. Neurologic:  Normal speech and language. No gross focal neurologic deficits are appreciated.  Skin:  Skin is warm, dry and intact. No rash noted. Psychiatric: Mood and affect are normal. Speech and behavior are normal.  ____________________________________________   LABS (all labs ordered are listed, but only abnormal results are displayed)  Labs Reviewed - No data to display ____________________________________________  EKG  ED ECG REPORT I, 03/08/20, the attending physician, personally viewed and interpreted this ECG.  Date: 03/06/2020 EKG Time: 9 AM Rate: 90 Rhythm: normal sinus rhythm QRS Axis: Right axis Intervals: normal ST/T Wave abnormalities: normal Narrative Interpretation: no evidence of acute ischemia  ____________________________________________  RADIOLOGY  DG Chest 2 View  Result Date: 03/06/2020 CLINICAL DATA:  Hypotension. EXAM: CHEST - 2 VIEW COMPARISON:  October 08, 2017. FINDINGS: The heart size and mediastinal contours are within normal limits. Both lungs are clear. No visible pleural effusions or pneumothorax. The visualized skeletal structures are unremarkable. IMPRESSION: No acute cardiopulmonary disease. Electronically Signed   By: October 10, 2017 MD   On: 03/06/2020 09:20    Chest x-ray reviewed negative for acute finding.  No noted cardiomegaly ____________________________________________   PROCEDURES  Procedure(s) performed: None  Procedures  Critical Care performed: No  ____________________________________________   INITIAL IMPRESSION / ASSESSMENT AND PLAN / ED COURSE  Pertinent labs & imaging results that were available during my care of the patient were reviewed by me and considered in my medical decision making (see chart for details).   Discussed with the patient in detail her history, she reports for years that she has experienced issues with lightheadedness and feeling as though she is going to pass out and has passed  out on several occasions while standing still.  She reports that this can happen abruptly anytime that she has been standing in the same position for more than a few minutes.  This has happened to her recently at work, also she reports this however has been a lifelong issue for several years.  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  She denies any change in her medical history or recent illness since her last lab work was done and discussed with her and she strongly does not wish to have labs drawn again, she reports it does not seem needed as she has not had a change in her health.  I think this is reasonable her previous labs are reviewed and very reassuring, and I do not think a repeat laboratory evaluation is necessary without any change in her health condition.  Discussed with the patient, counseled her and recommended  she follow-up with her employer to see what work modifications might be possible.  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  Advised her against this, and working with her employer is recommended and follow-up potentially with an occupational health team or physician.  Patient does have plan to see a primary care in the future, but her appointment is not for some time she reports.  Return precautions and treatment recommendations and follow-up discussed with the patient who is agreeable with the plan.  No evidence of emergent condition noted at this time.  Do not believe given the patient is eating and drinking well and her persistent history of lightheadedness and near syncope with standing for years, I do not think she would benefit  from receiving a liter of fluid today, especially in light of her desire not to have any blood work or an IV started which I think is reasonable through shared medical decision making        ____________________________________________   FINAL CLINICAL IMPRESSION(S) / ED DIAGNOSES  Final diagnoses:  Orthostatic hypotension        Note:  This document was prepared using Dragon voice recognition software and may include unintentional dictation errors       Sharyn Creamer, MD 03/06/20 [phone]

## 2020-04-24 ENCOUNTER — Encounter: Payer: Self-pay | Admitting: Internal Medicine

## 2020-04-24 ENCOUNTER — Other Ambulatory Visit: Payer: Self-pay

## 2020-04-24 ENCOUNTER — Encounter (INDEPENDENT_AMBULATORY_CARE_PROVIDER_SITE_OTHER): Payer: Self-pay

## 2020-04-24 ENCOUNTER — Ambulatory Visit: Payer: Self-pay | Admitting: Internal Medicine

## 2020-04-24 VITALS — BP 98/60 | HR 85 | Temp 98.0°F | Resp 16 | Ht <= 58 in | Wt 124.8 lb

## 2020-04-24 DIAGNOSIS — Z7689 Persons encountering health services in other specified circumstances: Secondary | ICD-10-CM

## 2020-04-24 DIAGNOSIS — I959 Hypotension, unspecified: Secondary | ICD-10-CM

## 2020-04-24 NOTE — Progress Notes (Signed)
[facility] [address] [place], [place] [address]  Internal MEDICINE  Office Visit Note  Patient Name: Dana Logan  [id number]  [id number]  Date of Service: 04/25/2020   Complaints/HPI Pt is here for establishment of PCP. Chief Complaint  Patient presents with  . New Patient (Initial Visit)    Low BP, acne, has mirena  . Gastroesophageal Reflux  . Quality Metric Gaps    pap   HPI Patient is here to establish care and address low blood pressure. Low BP for years. Associated symptoms seem to be worsening though. She describes becoming dizzy, to the point she can't stand up, feels hot and experienes tingling all over. Patient reports she has passed out once, 1 month ago. This was an unwitnessed event, but she thinks it was only a few seconds. Usually she can sit down before it gets to the point of feeling like she will pass out. She has been trying to increase her water intake.She does not eat breakfast because it makes her feel bad. She eats a good lunch and dinner. She has not been able to correlate when she tries to eat breakfast or not with dizzy spells. Her current job allows her to walk and not stand still, which has been helped some. Previously, if she stood still too long, the spells would come on. 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 She is concerned about operating this new machinery, but states she needs the job. Patient doing well otherwise.  Reports being active and wants to gain some weight. She reports previous weight loss of over 100 pounds 6 years ago, which she achieved via diet and exercise changes, but she has not been able to gain any weight recently despite eating large amounts. She thinks the dizzy spells started sometime since this weight loss.  Current Medication: Outpatient Encounter Medications as of 04/24/2020   Medication Sig  . levonorgestrel (MIRENA) 20 MCG/24HR IUD 1 each by Intrauterine route once.  . [DISCONTINUED] Baclofen 5 MG TABS Take 5 mg by mouth 3 (three) times daily as needed. (Patient not taking: Reported on 04/24/2020)   No facility-administered encounter medications on file as of 04/24/2020.    Surgical History: History reviewed. No pertinent surgical history.  Medical History: Past Medical History:  Diagnosis Date  . GERD (gastroesophageal reflux disease)   . Hypotension   . Ovarian cyst     Family History: Family History  Problem Relation Age of Onset  . Hypertension Mother   . Diabetes Maternal Aunt   . Hypertension Maternal Aunt   . Asthma Maternal Grandmother   . Diabetes Maternal Grandmother   . Hearing loss Maternal Grandmother   . Hypertension Maternal Grandmother   . Asthma Paternal Grandmother   . Hearing loss Paternal Grandmother     Social History   Socioeconomic History  . Marital status: Single    Spouse name: Not on file  . Number of children: Not on file  . Years of education: Not on file  . Highest education level: Not on file  Occupational History  . Not on file  Tobacco Use  . Smoking status: Former Smoker    Packs/day: 0.50    Types: Cigarettes    Quit date: 10/14/2019    Years since quitting: 0.5  . Smokeless tobacco: Never Used  Vaping Use  . Vaping Use: Never used  Substance and Sexual Activity  . Alcohol use: Yes    Alcohol/week: 2.0 standard drinks    Types: 2 Shots of liquor per week    Comment: 2 glasses during the week, more on the weekends  . Drug use: Not Currently    Types: Marijuana    Comment: daily  . Sexual activity: Never  Other Topics Concern  . Not on file  Social History Narrative  . Not on file   Social Determinants of Health   Financial Resource Strain: Not on file  Food Insecurity: Not on file  Transportation Needs: Not on file  Physical Activity: Not on file  Stress: Not on file  Social  Connections: Not on file  Intimate Partner Violence: Not on file     Review of Systems  Constitutional: Negative for activity change, appetite change, chills, diaphoresis, fatigue and unexpected weight change.  HENT: Negative for postnasal drip and sinus pressure.   Respiratory: Negative for cough and shortness of breath.   Cardiovascular: Negative for chest pain and palpitations.  Genitourinary: Negative for menstrual problem and vaginal bleeding.  Skin: Negative for color change.  Allergic/Immunologic: Negative for environmental allergies and food allergies.  Neurological: Positive for dizziness, syncope and light-headedness.  Psychiatric/Behavioral: Negative for agitation and behavioral problems (depression). The patient is nervous/anxious.        Admits she is nervous about office visit and lab work    Vital Signs: BP 98/60   Pulse 85   Temp 98 F (36.7 C)   Resp 16   Ht 4\' 9"  (1.448 m)   Wt 124 lb 12.8 oz (56.6 kg)   SpO2 98%   BMI 27.01 kg/m   Orthostatic vitals: Sitting BP 100/64, pulse 78. Standing BP 86/60, HR 86.  Physical Exam Constitutional:      General: She is not in acute distress.    Appearance: Normal appearance. She is well-developed and well-nourished. She is not diaphoretic.     Comments: Patient has distinct odor THC  HENT:     Head: Normocephalic and atraumatic.     Mouth/Throat:     Mouth: Oropharynx is clear and moist.  Eyes:     Extraocular Movements: Extraocular movements intact and EOM normal.     Pupils: Pupils are equal, round, and reactive to light.  Cardiovascular:     Rate and Rhythm: Normal rate and regular rhythm.     Heart sounds: Normal heart sounds. No murmur heard. No friction rub. No gallop.   Pulmonary:     Effort: Pulmonary effort is normal. No respiratory distress.     Breath sounds: Normal breath sounds. No wheezing or rales.  Chest:     Chest wall: No tenderness.  Musculoskeletal:        General: Normal range of motion.      Cervical back: Normal range of motion and neck supple.  Skin:    General: Skin is warm and dry.  Neurological:     Mental Status: She is alert and oriented to person, place, and time.  Psychiatric:        Mood and Affect: Mood and affect normal.        Behavior: Behavior normal.        Thought Content: Thought content normal.        Judgment: Judgment normal.     Assessment/Plan: 1. Hypotension, unspecified hypotension type Orthostatic vitals taken during visit and did not meet criteria for orthostatic hypotension at this time. Educated patient to increase salt  and fluid intake. Ordered CBC, CMP, ferritin, TSH and free T4 for other potential causes of dizziness and establish baseline.  2. Encounter to establish care Patient will return in 3 weeks for physical and PAP. Will review lab results at this time and obain a urine sample.  General Counseling: Lola verbalizes understanding of the findings of todays visit and agrees with plan of treatment. I have discussed any further diagnostic evaluation that may be needed or ordered today. We also reviewed her medications today. she has been encouraged to call the office with any questions or concerns that should arise related to todays visi  Time spent: 25 Minutes

## 2020-05-18 ENCOUNTER — Other Ambulatory Visit: Payer: Self-pay | Admitting: Physician Assistant

## 2020-11-15 ENCOUNTER — Other Ambulatory Visit: Payer: Self-pay

## 2020-11-15 ENCOUNTER — Emergency Department
Admission: EM | Admit: 2020-11-15 | Discharge: 2020-11-15 | Disposition: A | Discharge status: Home / Self Care | Payer: No Typology Code available for payment source | Attending: Emergency Medicine | Admitting: Emergency Medicine

## 2020-11-15 DIAGNOSIS — S39012A Strain of muscle, fascia and tendon of lower back, initial encounter: Secondary | ICD-10-CM | POA: Diagnosis not present

## 2020-11-15 DIAGNOSIS — Z87891 Personal history of nicotine dependence: Secondary | ICD-10-CM | POA: Insufficient documentation

## 2020-11-15 DIAGNOSIS — Y92410 Unspecified street and highway as the place of occurrence of the external cause: Secondary | ICD-10-CM | POA: Diagnosis not present

## 2020-11-15 DIAGNOSIS — S3992XA Unspecified injury of lower back, initial encounter: Secondary | ICD-10-CM | POA: Diagnosis present

## 2020-11-15 LAB — POC URINE PREG, ED: Preg Test, Ur: NEGATIVE

## 2020-11-15 MED ORDER — CYCLOBENZAPRINE HCL 5 MG PO TABS: 5.0000 mg | ORAL_TABLET | Freq: Three times a day (TID) | ORAL | 0 refills | Status: DC | PRN

## 2020-11-15 MED ORDER — IBUPROFEN 800 MG PO TABS: 800.0000 mg | ORAL_TABLET | Freq: Three times a day (TID) | ORAL | 0 refills | Status: DC | PRN
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
## 2020-12-19 ENCOUNTER — Emergency Department
Admission: EM | Admit: 2020-12-19 | Discharge: 2020-12-19 | Disposition: A | Discharge status: Home / Self Care | Payer: Self-pay | Attending: Emergency Medicine | Admitting: Emergency Medicine

## 2020-12-19 DIAGNOSIS — M79672 Pain in left foot: Secondary | ICD-10-CM | POA: Insufficient documentation

## 2020-12-19 DIAGNOSIS — Z5321 Procedure and treatment not carried out due to patient leaving prior to being seen by health care provider: Secondary | ICD-10-CM | POA: Insufficient documentation

## 2020-12-19 NOTE — ED Notes (Signed)
No answer when called several times from lobby 

## 2020-12-19 NOTE — ED Triage Notes (Signed)
Pt presents to ED with c/o of having L foot pain. Pt states she cannot bear full weight on this L foot. Pt denies or trauma to this foot recently. NAD noted. Pot states noticed new tender spot on the inside of L foot.

## 2020-12-20 ENCOUNTER — Encounter: Payer: Self-pay | Admitting: Emergency Medicine

## 2020-12-20 ENCOUNTER — Emergency Department: Payer: Self-pay

## 2020-12-20 ENCOUNTER — Other Ambulatory Visit: Payer: Self-pay

## 2020-12-20 ENCOUNTER — Emergency Department
Admission: EM | Admit: 2020-12-20 | Discharge: 2020-12-20 | Disposition: A | Discharge status: Home / Self Care | Payer: Self-pay | Attending: Emergency Medicine | Admitting: Emergency Medicine

## 2020-12-20 DIAGNOSIS — M65272 Calcific tendinitis, left ankle and foot: Secondary | ICD-10-CM | POA: Insufficient documentation

## 2020-12-20 DIAGNOSIS — Z87891 Personal history of nicotine dependence: Secondary | ICD-10-CM | POA: Insufficient documentation

## 2020-12-20 DIAGNOSIS — M7752 Other enthesopathy of left foot: Secondary | ICD-10-CM

## 2020-12-20 DIAGNOSIS — R229 Localized swelling, mass and lump, unspecified: Secondary | ICD-10-CM | POA: Insufficient documentation

## 2020-12-20 MED ORDER — MELOXICAM 15 MG PO TABS: 15.0000 mg | ORAL_TABLET | Freq: Every day | ORAL | 0 refills | Status: DC

## 2020-12-20 MED ORDER — MELOXICAM 7.5 MG PO TABS
15.0000 mg | ORAL_TABLET | Freq: Once | ORAL | Status: DC
  Filled 2020-12-20: qty 2

## 2020-12-20 NOTE — ED Provider Notes (Signed)
[facility] Emergency Department Provider Note  ____________________________________________  Time seen: Approximately 8:02 PM  I have reviewed the triage vital signs and the nursing notes.   HISTORY  Chief Complaint Foot Pain    HPI Dana Logan is a 25 y.o. female who presents the emergency department complaining of left foot pain.  Patient has had issues in the past with her feet when standing for prolonged periods of time.  She states that she worked in [employer] and used to stand for long periods.  Patient states that she has had some pain and swelling along the medial aspect of her foot.  No recent injuries.  No prolonged standing at this time.  Patient denies any other injury or complaint at this time.       Past Medical History:  Diagnosis Date   GERD (gastroesophageal reflux disease)    Hypotension    Ovarian cyst     There are no problems to display for this patient.   History reviewed. No pertinent surgical history.  Prior to Admission medications   Medication Sig Start Date End Date Taking? Authorizing Provider  meloxicam (MOBIC) 15 MG tablet Take 1 tablet (15 mg total) by mouth daily. 12/20/20  Yes Liahm Grivas, Delorise Royals, PA-C  cyclobenzaprine (FLEXERIL) 5 MG tablet Take 1 tablet (5 mg total) by mouth 3 (three) times daily as needed. 11/15/20   Menshew, Charlesetta Ivory, PA-C  ibuprofen (ADVIL) 800 MG tablet Take 1 tablet (800 mg total) by mouth every 8 (eight) hours as needed. 11/15/20   Menshew, Charlesetta Ivory, PA-C  levonorgestrel (MIRENA) 20 MCG/24HR IUD 1 each by Intrauterine route once.    [provider]    Allergies Patient has no known allergies.  Family History  Problem Relation Age of Onset   Hypertension Mother    Diabetes Maternal Aunt    Hypertension Maternal Aunt    Asthma Maternal Grandmother    Diabetes Maternal Grandmother    Hearing loss Maternal Grandmother    Hypertension Maternal Grandmother    Asthma  Paternal Grandmother    Hearing loss Paternal Grandmother     Social History Social History   Tobacco Use   Smoking status: Former    Packs/day: 0.50    Types: Cigarettes    Quit date: 10/14/2019    Years since quitting: 1.1   Smokeless tobacco: Never  Vaping Use   Vaping Use: Never used  Substance Use Topics   Alcohol use: Yes    Alcohol/week: 2.0 standard drinks    Types: 2 Shots of liquor per week    Comment: 2 glasses during the week, more on the weekends   Drug use: Not Currently    Types: Marijuana    Comment: daily     Review of Systems  Constitutional: No fever/chills Eyes: No visual changes. No discharge ENT: No upper respiratory complaints. Cardiovascular: no chest pain. Respiratory: no cough. No SOB. Gastrointestinal: No abdominal pain.  No nausea, no vomiting.  No diarrhea.  No constipation. Musculoskeletal: Positive for left foot pain Skin: Negative for rash, abrasions, lacerations, ecchymosis. Neurological: Negative for headaches, focal weakness or numbness.  10 System ROS otherwise negative.  ____________________________________________   PHYSICAL EXAM:  VITAL SIGNS: ED Triage Vitals  Enc Vitals Group     BP 12/20/20 1822 112/67     Pulse Rate 12/20/20 1819 81     Resp 12/20/20 1819 16     Temp 12/20/20 1819 98.7 F (37.1 C)  Temp Source 12/20/20 1819 Oral     SpO2 12/20/20 1819 98 %     Weight 12/20/20 1820 125 lb (56.7 kg)     Height 12/20/20 1820 4\' 9"  (1.448 m)     Head Circumference --      Peak Flow --      Pain Score 12/20/20 1820 8     Pain Loc --      Pain Edu? --      Excl. in GC? --      Constitutional: Alert and oriented. Well appearing and in no acute distress. Eyes: Conjunctivae are normal. PERRL. EOMI. Head: Atraumatic. ENT:      Ears:       Nose: No congestion/rhinnorhea.      Mouth/Throat: Mucous membranes are moist.  Neck: No stridor.    Cardiovascular: Normal rate, regular rhythm. Normal S1 and S2.  Good  peripheral circulation. Respiratory: Normal respiratory effort without tachypnea or retractions. Lungs CTAB. Good air entry to the bases with no decreased or absent breath sounds. Musculoskeletal: Full range of motion to all extremities. No gross deformities appreciated. Neurologic:  Normal speech and language. No gross focal neurologic deficits are appreciated.  Skin:  Skin is warm, dry and intact. No rash noted. Psychiatric: Mood and affect are normal. Speech and behavior are normal. Patient exhibits appropriate insight and judgement.   ____________________________________________   LABS (all labs ordered are listed, but only abnormal results are displayed)  Labs Reviewed - No data to display ____________________________________________  EKG   ____________________________________________  RADIOLOGY I personally viewed and evaluated these images as part of my medical decision making, as well as reviewing the written report by the radiologist.  ED Provider Interpretation: No acute traumatic finding Left foot x-ray.  DG Foot Complete Left  Result Date: 12/20/2020 CLINICAL DATA:  Left foot pain EXAM: LEFT FOOT - COMPLETE 3+ VIEW COMPARISON:  None. FINDINGS: There is no evidence of fracture or dislocation. There is no evidence of arthropathy or other focal bone abnormality. Soft tissues are unremarkable. IMPRESSION: Negative. Electronically Signed   By: 02/19/2021 M.D.   On: 12/20/2020 19:14    ____________________________________________    PROCEDURES  Procedure(s) performed:    Procedures    Medications  meloxicam (MOBIC) tablet 15 mg (has no administration in time range)     ____________________________________________   INITIAL IMPRESSION / ASSESSMENT AND PLAN / ED COURSE  Pertinent labs & imaging results that were available during my care of the patient were reviewed by me and considered in my medical decision making (see chart for details).  Review of the  [place] CSRS was performed in accordance of the [facility] prior to dispensing any controlled drugs.           Patient's diagnosis is consistent with tendinitis of foot.  Patient presents emergency department with medial foot pain.  Patient has had some issues with her feet due to prolonged standing in the past.  No recent injuries.  Findings were consistent with tendinitis.  X-ray was reassuring.  No indication for further work-up.  Patient will have postop shoe and anti-inflammatories for symptom relief.  Follow-up with primary care or podiatry as needed..  Patient is given ED precautions to return to the ED for any worsening or new symptoms.     ____________________________________________  FINAL CLINICAL IMPRESSION(S) / ED DIAGNOSES  Final diagnoses:  Tendinitis of left foot      NEW MEDICATIONS STARTED DURING THIS VISIT:  ED Discharge Orders  Ordered    meloxicam (MOBIC) 15 MG tablet  Daily        12/20/20 2008                This chart was dictated using voice recognition software/Dragon. Despite best efforts to proofread, errors can occur which can change the meaning. Any change was purely unintentional.    Racheal Patches, PA-C 12/20/20 2009    Georga Hacking, MD 12/21/20 [phone]

## 2020-12-20 NOTE — ED Triage Notes (Signed)
Pt reports that she is having left foot pain, she has been walking on the outside of her foot because of the pain. No known injury. 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

## 2020-12-20 NOTE — ED Notes (Signed)
See triage note  Presents with left foot and ankle pain  Denies any injury  States she walks a lot at work  Ambulates with sl limp

## 2020-12-26 ENCOUNTER — Ambulatory Visit (LOCAL_COMMUNITY_HEALTH_CENTER): Payer: Self-pay

## 2020-12-26 ENCOUNTER — Other Ambulatory Visit: Payer: Self-pay

## 2020-12-26 DIAGNOSIS — Z111 Encounter for screening for respiratory tuberculosis: Secondary | ICD-10-CM

## 2020-12-29 ENCOUNTER — Ambulatory Visit (LOCAL_COMMUNITY_HEALTH_CENTER): Payer: Self-pay

## 2020-12-29 ENCOUNTER — Other Ambulatory Visit: Payer: Self-pay

## 2020-12-29 DIAGNOSIS — Z111 Encounter for screening for respiratory tuberculosis: Secondary | ICD-10-CM

## 2020-12-29 LAB — TB SKIN TEST
Induration: 0 mm
TB Skin Test: NEGATIVE

## 2020-12-31 ENCOUNTER — Emergency Department
Admission: EM | Admit: 2020-12-31 | Discharge: 2020-12-31 | Disposition: A | Discharge status: Home / Self Care | Payer: Self-pay | Attending: Emergency Medicine | Admitting: Emergency Medicine

## 2020-12-31 ENCOUNTER — Other Ambulatory Visit: Payer: Self-pay

## 2020-12-31 ENCOUNTER — Emergency Department: Payer: Self-pay

## 2020-12-31 DIAGNOSIS — Z5321 Procedure and treatment not carried out due to patient leaving prior to being seen by health care provider: Secondary | ICD-10-CM | POA: Insufficient documentation

## 2020-12-31 DIAGNOSIS — K5900 Constipation, unspecified: Secondary | ICD-10-CM | POA: Insufficient documentation

## 2020-12-31 NOTE — ED Triage Notes (Signed)
Pt states she has not had a BM in 3 days- pt states she took some MOM at home butstill has not used the bathroom- last BM was very hard and like "little drops"
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
## 2020-12-31 NOTE — ED Notes (Signed)
This RN over to assess patient due to patient c/o feeling like she is going to pass out. Pt also c/o hand cramping when the blood pressure cuff taking her BP. VS re-assessed by this RN. Pt also c/o increasing pain to her abd, explained will pull patient back for EKG and blood work. Pt states, "no they they can get it when they get my IV". Care discussed with Loralyn Freshwater, PA-C, agreement with plan of care.

## 2021-05-08 ENCOUNTER — Emergency Department
Admission: EM | Admit: 2021-05-08 | Discharge: 2021-05-08 | Disposition: A | Discharge status: Home / Self Care | Payer: 59 | Attending: Emergency Medicine | Admitting: Emergency Medicine

## 2021-05-08 ENCOUNTER — Encounter: Payer: Self-pay | Admitting: Emergency Medicine

## 2021-05-08 ENCOUNTER — Other Ambulatory Visit: Payer: Self-pay

## 2021-05-08 ENCOUNTER — Emergency Department: Payer: 59

## 2021-05-08 DIAGNOSIS — R519 Headache, unspecified: Secondary | ICD-10-CM | POA: Insufficient documentation

## 2021-05-08 DIAGNOSIS — I10 Essential (primary) hypertension: Secondary | ICD-10-CM | POA: Diagnosis not present

## 2021-05-08 DIAGNOSIS — R55 Syncope and collapse: Secondary | ICD-10-CM | POA: Diagnosis not present

## 2021-05-08 DIAGNOSIS — R42 Dizziness and giddiness: Secondary | ICD-10-CM | POA: Insufficient documentation

## 2021-05-08 LAB — BASIC METABOLIC PANEL
Anion gap: 8 (ref 5–15)
BUN: 10 mg/dL (ref 6–20)
CO2: 23 mmol/L (ref 22–32)
Calcium: 9.1 mg/dL (ref 8.9–10.3)
Chloride: 105 mmol/L (ref 98–111)
Creatinine, Ser: 0.64 mg/dL (ref 0.44–1.00)
GFR, Estimated: >60 mL/min (ref >60)
Glucose, Bld: 83 mg/dL (ref 70–99)
Potassium: 3.4 mmol/L — ABNORMAL LOW (ref 3.5–5.1)
Sodium: 136 mmol/L (ref 135–145)

## 2021-05-08 LAB — CBC WITH DIFFERENTIAL/PLATELET
Abs Immature Granulocytes: 0.03 10*3/uL (ref 0.00–0.07)
Basophils Absolute: 0 10*3/uL (ref 0.0–0.1)
Basophils Relative: 1 %
Eosinophils Absolute: 0.1 10*3/uL (ref 0.0–0.5)
Eosinophils Relative: 2 %
HCT: 40.8 % (ref 36.0–46.0)
Hemoglobin: 13.8 g/dL (ref 12.0–15.0)
Immature Granulocytes: 0 %
Lymphocytes Relative: 24 %
Lymphs Abs: 1.7 10*3/uL (ref 0.7–4.0)
MCH: 29.9 pg (ref 26.0–34.0)
MCHC: 33.8 g/dL (ref 30.0–36.0)
MCV: 88.5 fL (ref 80.0–100.0)
Monocytes Absolute: 1 10*3/uL (ref 0.1–1.0)
Monocytes Relative: 14 %
Neutro Abs: 4.2 10*3/uL (ref 1.7–7.7)
Neutrophils Relative %: 59 %
Platelets: 338 10*3/uL (ref 150–400)
RBC: 4.61 MIL/uL (ref 3.87–5.11)
RDW: 12.1 % (ref 11.5–15.5)
WBC: 7.2 10*3/uL (ref 4.0–10.5)
nRBC: 0 % (ref 0.0–0.2)

## 2021-05-08 LAB — URINALYSIS, ROUTINE W REFLEX MICROSCOPIC
Glucose, UA: NEGATIVE mg/dL
Ketones, ur: 40 mg/dL — AB
Nitrite: NEGATIVE
Protein, ur: NEGATIVE mg/dL
Specific Gravity, Urine: 1.025 (ref 1.005–1.030)
pH: 5.5 (ref 5.0–8.0)

## 2021-05-08 LAB — URINALYSIS, MICROSCOPIC (REFLEX)

## 2021-05-08 LAB — POC URINE PREG, ED: Preg Test, Ur: NEGATIVE

## 2021-05-08 MED ORDER — KETOROLAC TROMETHAMINE 60 MG/2ML IM SOLN: 60.0000 mg | Freq: Once | INTRAMUSCULAR | Status: DC

## 2021-05-08 MED ORDER — METOCLOPRAMIDE HCL 5 MG/ML IJ SOLN
10.0000 mg | Freq: Once | INTRAMUSCULAR | Status: AC
  Administered 2021-05-08: 17:00:00 10 mg via INTRAVENOUS
  Filled 2021-05-08: qty 2

## 2021-05-08 MED ORDER — DIPHENHYDRAMINE HCL 25 MG PO CAPS
25.0000 mg | ORAL_CAPSULE | Freq: Four times a day (QID) | ORAL | Status: DC | PRN
  Administered 2021-05-08: 17:00:00 25 mg via ORAL
  Filled 2021-05-08: qty 1

## 2021-05-08 MED ORDER — METOCLOPRAMIDE HCL 10 MG PO TABS: 10.0000 mg | ORAL_TABLET | Freq: Three times a day (TID) | ORAL | 0 refills | Status: DC

## 2021-05-08 NOTE — ED Provider Triage Note (Signed)
Emergency Medicine Provider Triage Evaluation Note  Dana Logan, a 26 y.o. female  was evaluated in triage.  Pt complains of  near-syncope.  She was at work, she began experience some dizziness associated with her headache that began yesterday.  Her coworkers called EMS and evaluated the patient and found to have normal blood sugar and normal to hypertensive BP readings.  Patient took 2000 g Tylenol with no benefit to the headache pain.  She presented to [facility] yesterday for the headache migration but left prior to evaluation, secondary to the protracted wait.  Review of Systems  Positive: Headache, near-syncope Negative: NV, vision change  Physical Exam  BP (!) 134/112 (BP Location: Left Arm)    Pulse 95    Temp 98.8 F (37.1 C) (Oral)    Resp 18    Ht 4\' 9"  (1.448 m)    Wt 56.7 kg    SpO2 100%    BMI 27.05 kg/m  Gen:   Awake, no distress   Resp:  Normal effort  MSK:   Moves extremities without difficulty  Other:  CVS: RRR  Medical Decision Making  Medically screening exam initiated at 12:42 PM.  Appropriate orders placed.  Jasiel Florencio was informed that the remainder of the evaluation will be completed by another provider, this initial triage assessment does not replace that evaluation, and the importance of remaining in the ED until their evaluation is complete.  Patient ED evaluation of headache with onset yesterday, with and near syncope today.  She denies any nausea, vomiting, vision loss.  She has declined initial blood draw in triage.   Melvenia Needles, PA-C 05/08/21 1255

## 2021-05-08 NOTE — ED Provider Notes (Signed)
[facility] Provider Note    Event Date/Time   First MD Initiated Contact with Patient 05/08/21 1408     (approximate)   History   Dizziness and Headache   HPI  Dana Logan is a 26 y.o. female   presents to the ED via EMS from work with complaint of dizziness and headache.  Patient states that she went to [facility] yesterday for the same complaint and left prior to being seen due to the long wait.  Patient states that she was walking at work and had a near syncopal episode which caused staff to call EMS.  Patient denies any previous head injury, nausea, vomiting or diarrhea.  Patient reports that dizziness is worse with certain positions especially bending over.  She has a history of hypertension, anemia, and currently has a Mirena which is overdue for being removed.  She reports her pain is 9 out of 10.      Physical Exam   Triage Vital Signs: ED Triage Vitals [05/08/21 1240]  Enc Vitals Group     BP (!) 134/112     Pulse Rate 95     Resp 18     Temp 98.8 F (37.1 C)     Temp Source Oral     SpO2 100 %     Weight 125 lb (56.7 kg)     Height 4\' 9"  (1.448 m)     Head Circumference      Peak Flow      Pain Score 9     Pain Loc      Pain Edu?      Excl. in GC?     Most recent vital signs: Vitals:   05/08/21 1240 05/08/21 1449  BP: (!) 134/112 130/90  Pulse: 95 88  Resp: 18 18  Temp: 98.8 F (37.1 C)   SpO2: 100% 100%     General: Awake, no distress.  CV:  Good peripheral perfusion.  Heart regular rate and rhythm without murmur. Resp:  Normal effort.  Lungs are clear bilaterally. Abd:  No distention.  Other:  Conjunctive are clear, PERRLA, EOMI without nystagmus.  Cranial nerves II through XII grossly intact.  Good muscle strength bilaterally.  Patient is able to ambulate without any assistance.   ED Results / Procedures / Treatments   Labs (all labs ordered are listed, but only abnormal results are displayed) Labs Reviewed   URINALYSIS, ROUTINE W REFLEX MICROSCOPIC - Abnormal; Notable for the following components:      Result Value   Hgb urine dipstick MODERATE (*)    Bilirubin Urine SMALL (*)    Ketones, ur 40 (*)    Leukocytes,Ua TRACE (*)    All other components within normal limits  BASIC METABOLIC PANEL - Abnormal; Notable for the following components:   Potassium 3.4 (*)    All other components within normal limits  URINALYSIS, MICROSCOPIC (REFLEX) - Abnormal; Notable for the following components:   Bacteria, UA RARE (*)    All other components within normal limits  CBC WITH DIFFERENTIAL/PLATELET  POC URINE PREG, ED     EKG  Normal sinus rhythm Vent. rate 76 BPM PR interval 160 ms QRS duration 78 ms QT/QTcB 374/420 ms P-R-T axes 34 81 49   RADIOLOGY CT head pending at the time of handover to 05/10/21 PA-C   PROCEDURES:  Critical Care performed: No  Procedures   MEDICATIONS ORDERED IN ED: Medications  metoCLOPramide (REGLAN) injection 10 mg (10  mg Intravenous Given 05/08/21 1715)     IMPRESSION / MDM / ASSESSMENT AND PLAN / ED COURSE  I reviewed the triage vital signs and the nursing notes.    Differential diagnosis includes, but is not limited to, vertigo, nonspecific headache, near syncopal episode, anemia, pregnancy, hypoglycemia, arrhythmia.    ----------------------------------------- 4:12 PM on 05/08/2021 ----------------------------------------- At this time care of this patient is being turned over to [employer] PA-C who saw her initially in triage and knows patient's history.  CT scan is pending at this time.  CBC with reassuring however basic metabolic panel and urinalysis is still pending.  Pregnancy test was negative.   FINAL CLINICAL IMPRESSION(S) / ED DIAGNOSES   Final diagnoses:  Acute nonintractable headache, unspecified headache type     Rx / DC Orders   ED Discharge Orders          Ordered    metoCLOPramide (REGLAN) 10 MG tablet  3 times  daily with meals        05/08/21 1658             Note:  This document was prepared using Dragon voice recognition software and may include unintentional dictation errors.   Tommi Rumps, PA-C 05/09/21 5366    Concha Se, MD 05/09/21 [phone]

## 2021-05-08 NOTE — ED Triage Notes (Signed)
Pt to ED via [facility] with c/o headache,from work, she took 1000mg  of Tylenol with no relief. She was seen at [facility] yesterday for dizziness. VS WNL

## 2021-05-08 NOTE — ED Triage Notes (Signed)
Pt here via [facility] from work with dizziness and a headache. Pt states that she went to [facility] yesterday for same but left due to wait. Pt states she was walking at work and had a near syncopal episode and then staff called ems. Pt in NAD in triage.

## 2021-05-08 NOTE — Discharge Instructions (Addendum)
Your exam, labs, and CT scan are all normal and reassuring. Take the prescription nausea medicine along with OTC Benadryl, tylenol and ibuprofen for headache pain relief. You should follow-up with [facility] clinic for continued symptoms. Return to the ED if needed.

## 2021-05-08 NOTE — ED Notes (Signed)
See triage note  presents with dizziness and headache   states she was at work had near syncopal episode   no fever
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
## 2022-06-01 ENCOUNTER — Emergency Department
Admission: EM | Admit: 2022-06-01 | Discharge: 2022-06-01 | Disposition: A | Discharge status: Home / Self Care | Payer: BLUE CROSS/BLUE SHIELD | Attending: Emergency Medicine | Admitting: Emergency Medicine

## 2022-06-01 ENCOUNTER — Other Ambulatory Visit: Payer: Self-pay

## 2022-06-01 ENCOUNTER — Emergency Department: Payer: BLUE CROSS/BLUE SHIELD

## 2022-06-01 DIAGNOSIS — S60211A Contusion of right wrist, initial encounter: Secondary | ICD-10-CM | POA: Diagnosis not present

## 2022-06-01 DIAGNOSIS — W010XXA Fall on same level from slipping, tripping and stumbling without subsequent striking against object, initial encounter: Secondary | ICD-10-CM | POA: Insufficient documentation

## 2022-06-01 DIAGNOSIS — M25531 Pain in right wrist: Secondary | ICD-10-CM | POA: Insufficient documentation

## 2022-06-01 DIAGNOSIS — S59911A Unspecified injury of right forearm, initial encounter: Secondary | ICD-10-CM | POA: Diagnosis present

## 2022-06-01 DIAGNOSIS — S5011XA Contusion of right forearm, initial encounter: Secondary | ICD-10-CM | POA: Insufficient documentation

## 2022-06-01 MED ORDER — ACETAMINOPHEN 325 MG PO TABS
650.0000 mg | ORAL_TABLET | Freq: Once | ORAL | Status: AC
  Administered 2022-06-01: 650 mg via ORAL
  Filled 2022-06-01: qty 2
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
## 2022-06-01 NOTE — Discharge Instructions (Signed)
Your x-rays were negative for any acute bony injury.  Rest, ice, elevate your arm.  You may continue to take Tylenol/ibuprofen per package instructions to help with any discomfort.  Please return for any new, worsening, or change in symptoms or other concerns.  It was a pleasure caring for you today.

## 2022-06-02 ENCOUNTER — Emergency Department
Admission: EM | Admit: 2022-06-02 | Discharge: 2022-06-02 | Disposition: A | Discharge status: Home / Self Care | Payer: Self-pay | Attending: Emergency Medicine | Admitting: Emergency Medicine

## 2022-06-02 ENCOUNTER — Emergency Department: Payer: Self-pay

## 2022-06-02 ENCOUNTER — Encounter: Payer: Self-pay | Admitting: Emergency Medicine

## 2022-06-02 DIAGNOSIS — R079 Chest pain, unspecified: Secondary | ICD-10-CM | POA: Insufficient documentation

## 2022-06-02 DIAGNOSIS — I10 Essential (primary) hypertension: Secondary | ICD-10-CM | POA: Insufficient documentation

## 2022-06-02 MED ORDER — MELOXICAM 15 MG PO TABS: 15.0000 mg | ORAL_TABLET | Freq: Every day | ORAL | 11 refills | Status: AC

## 2022-06-02 MED ORDER — CYCLOBENZAPRINE HCL 10 MG PO TABS: 10.0000 mg | ORAL_TABLET | Freq: Three times a day (TID) | ORAL | 0 refills | Status: AC | PRN

## 2022-06-02 MED ORDER — CYCLOBENZAPRINE HCL 10 MG PO TABS
5.0000 mg | ORAL_TABLET | Freq: Once | ORAL | Status: AC
  Administered 2022-06-02: 5 mg via ORAL
  Filled 2022-06-02: qty 1

## 2022-06-02 MED ORDER — MELOXICAM 7.5 MG PO TABS
15.0000 mg | ORAL_TABLET | Freq: Once | ORAL | Status: AC
  Administered 2022-06-02: 15 mg via ORAL
  Filled 2022-06-02: qty 2
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
## 2022-06-02 NOTE — Discharge Instructions (Addendum)
-  Fortunately, your x-rays not show any signs of rib fractures.  I suspect though that you will likely be sore over the next 1 to 2 weeks.  You may take the meloxicam as needed for pain.  You may additionally take the cyclobenzaprine for muscle laxation, the use caution as may make you dizzy/drowsy.  You may opt to only taken 1 at night if you prefer.  -Please follow-up with your primary care provider as needed.  -Return to the emergency department anytime if you begin to experience any new or worsening symptoms.
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
## 2022-07-10 ENCOUNTER — Emergency Department
Admission: EM | Admit: 2022-07-10 | Discharge: 2022-07-10 | Discharge status: Against Medical Advice | Payer: Self-pay | Attending: Emergency Medicine | Admitting: Emergency Medicine

## 2022-07-10 ENCOUNTER — Other Ambulatory Visit: Payer: Self-pay

## 2022-07-10 DIAGNOSIS — Z5321 Procedure and treatment not carried out due to patient leaving prior to being seen by health care provider: Secondary | ICD-10-CM | POA: Insufficient documentation

## 2022-07-10 DIAGNOSIS — K649 Unspecified hemorrhoids: Secondary | ICD-10-CM | POA: Insufficient documentation

## 2022-07-10 NOTE — ED Triage Notes (Signed)
Pt to ED via POV c/o hemorrhoids. Pt says it has been there since Monday and has been getting more uncomfortable and getting bigger since then. Has not used any cream/ointment

## 2022-10-03 ENCOUNTER — Encounter: Payer: Self-pay | Admitting: *Deleted

## 2022-10-03 ENCOUNTER — Emergency Department
Admission: EM | Admit: 2022-10-03 | Discharge: 2022-10-03 | Disposition: A | Discharge status: Home / Self Care | Payer: BLUE CROSS/BLUE SHIELD | Attending: Emergency Medicine | Admitting: Emergency Medicine

## 2022-10-03 ENCOUNTER — Other Ambulatory Visit: Payer: Self-pay

## 2022-10-03 DIAGNOSIS — R21 Rash and other nonspecific skin eruption: Secondary | ICD-10-CM | POA: Diagnosis present

## 2022-10-03 DIAGNOSIS — B360 Pityriasis versicolor: Secondary | ICD-10-CM | POA: Insufficient documentation

## 2022-10-03 MED ORDER — KETOCONAZOLE 2 % EX CREA: 1.0000 | TOPICAL_CREAM | Freq: Every day | CUTANEOUS | 0 refills | Status: DC

## 2022-10-03 MED ORDER — SELENIUM SULFIDE 2.25 % EX SHAM: MEDICATED_SHAMPOO | CUTANEOUS | 0 refills | Status: DC

## 2022-10-03 NOTE — Discharge Instructions (Addendum)
Apply selenium sulfate shampoo to face and neck and other areas of body where you have rash.  Let shampoo sit for 10 minutes and then wash off thoroughly. Like ketoconazole lotion to face, neck and other affected areas daily for the next 2 weeks.

## 2022-10-03 NOTE — ED Provider Notes (Signed)
[facility] Provider Note  Patient Contact: 5:06 PM (approximate)   History   Rash   HPI  Dana Logan is a 27 y.o. female presents to the emergency department with a macular, hyperpigmented rash of face, chest and upper back that has been present for the past 2 months.  Patient denies starting recent medications and has not been on any oral or topical steroids.  She denies sick contacts in the home with similar symptoms.  No fever or chills.  No tick bites.      Physical Exam   Triage Vital Signs: ED Triage Vitals [10/03/22 1559]  Enc Vitals Group     BP 97/81     Pulse Rate 87     Resp 18     Temp 98.8 F (37.1 C)     Temp Source Oral     SpO2 100 %     Weight 117 lb (53.1 kg)     Height 4\' 9"  (1.448 m)     Head Circumference      Peak Flow      Pain Score 0     Pain Loc      Pain Edu?      Excl. in [place]?     Most recent vital signs: Vitals:   10/03/22 1559  BP: 97/81  Pulse: 87  Resp: 18  Temp: 98.8 F (37.1 C)  SpO2: 100%     General: Alert and in no acute distress. Eyes:  PERRL. EOMI. Head: No acute traumatic findings ENT:      Nose: No congestion/rhinnorhea.      Mouth/Throat: Mucous membranes are moist. Neck: No stridor. No cervical spine tenderness to palpation. Cardiovascular:  Good peripheral perfusion Respiratory: Normal respiratory effort without tachypnea or retractions. Lungs CTAB. Good air entry to the bases with no decreased or absent breath sounds. Gastrointestinal: Bowel sounds 4 quadrants. Soft and nontender to palpation. No guarding or rigidity. No palpable masses. No distention. No CVA tenderness. Musculoskeletal: Full range of motion to all extremities.  Neurologic:  No gross focal neurologic deficits are appreciated.  Skin: Patient has macular, hyperpigmented rash of face, chest and upper back.   ED Results / Procedures / Treatments   Labs (all labs ordered are listed, but only abnormal results  are displayed) Labs Reviewed - No data to display      PROCEDURES:  Critical Care performed: No  Procedures   MEDICATIONS ORDERED IN ED: Medications - No data to display   IMPRESSION / MDM / ASSESSMENT AND PLAN / ED COURSE  I reviewed the triage vital signs and the nursing notes.                              Assessment and plan Tinea versicolor 27 year old female presents to the emergency department with a macular, hyperpigmented rash consistent with tinea versicolor.  Patient was prescribed selenium sulfide shampoo and given patient instructions on how to use it and was advised to use topical ketoconazole for the next 2 weeks.     FINAL CLINICAL IMPRESSION(S) / ED DIAGNOSES   Final diagnoses:  Tinea versicolor     Rx / DC Orders   ED Discharge Orders          Ordered    ketoconazole (NIZORAL) 2 % cream  Daily        10/03/22 1703    Selenium Sulfide 2.25 % SHAM  10/03/22 1703             Note:  This document was prepared using Dragon voice recognition software and may include unintentional dictation errors.   Pia Mau [place], PA-C 10/03/22 1708    Chesley Noon, MD 10/03/22 [phone]

## 2022-10-03 NOTE — ED Triage Notes (Signed)
Pt reports a rash on face and chest for 1 month. No itching.  Pt alert

## 2022-11-23 ENCOUNTER — Other Ambulatory Visit: Payer: Self-pay

## 2022-11-23 ENCOUNTER — Emergency Department
Admission: EM | Admit: 2022-11-23 | Discharge: 2022-11-23 | Disposition: A | Discharge status: Home / Self Care | Payer: 59 | Attending: Emergency Medicine | Admitting: Emergency Medicine

## 2022-11-23 DIAGNOSIS — L0291 Cutaneous abscess, unspecified: Secondary | ICD-10-CM

## 2022-11-23 DIAGNOSIS — L02215 Cutaneous abscess of perineum: Secondary | ICD-10-CM | POA: Insufficient documentation

## 2022-11-23 MED ORDER — LIDOCAINE HCL (PF) 1 % IJ SOLN
5.0000 mL | Freq: Once | INTRAMUSCULAR | Status: AC
  Administered 2022-11-23: 5 mL
  Filled 2022-11-23: qty 5

## 2022-11-23 MED ORDER — METRONIDAZOLE 500 MG PO TABS: 500.0000 mg | ORAL_TABLET | Freq: Two times a day (BID) | ORAL | 0 refills | Status: DC

## 2022-11-23 MED ORDER — CEPHALEXIN 500 MG PO CAPS: 500.0000 mg | ORAL_CAPSULE | Freq: Four times a day (QID) | ORAL | 0 refills | Status: AC

## 2022-11-23 NOTE — ED Triage Notes (Signed)
Cyst to rectal area x 3 days.

## 2022-11-23 NOTE — ED Provider Notes (Signed)
[facility] Emergency Department Provider Note     Event Date/Time   First MD Initiated Contact with Patient 11/23/22 1206     (approximate)   History   Abscess   HPI  Dana Logan is a 27 y.o. female presents to the ED for complaint of abscess on the right perianal region x 3 days.  Patient reports history of cyst in this area.  Normally the cyst will spontaneously drain with warm compress but patient reports this cyst is worse.  Patient complains of severe constant pain. Pain score 10/10. She is unable to sit without discomfort.  Has tried Tylenol and sitz bath's with no relief.  Patient denies fever, changes in bowel movement, and abdominal pain.  No other complaints at this time.    Physical Exam   Triage Vital Signs: ED Triage Vitals [11/23/22 1154]  Encounter Vitals Group     BP      Systolic BP Percentile      Diastolic BP Percentile      Pulse      Resp      Temp      Temp src      SpO2      Weight 117 lb 1 oz (53.1 kg)     Height 4\' 9"  (1.448 m)     Head Circumference      Peak Flow      Pain Score 10     Pain Loc      Pain Education      Exclude from Growth Chart     Most recent vital signs: Vitals:   11/23/22 1228  BP: (!) 114/54  Pulse: 92  Resp: 16  Temp: 98.6 F (37 C)  SpO2: 94%   General Awake, no distress.  Well-appearing. HEENT NCAT.  CV:  Good peripheral perfusion.  RESP:  Normal effort.  ABD:  No distention.  Other:  There is a single 3x4 cm abscess with induration on right perianal area.  Mild erythema.  Fluctuance palpated.  No central fluctuance noted.  Mobile.  TTP   ED Results / Procedures / Treatments   Labs (all labs ordered are listed, but only abnormal results are displayed) Labs Reviewed - No data to display  I personally viewed and evaluated ultrasound, performed by myself as a part of my medical decision making.  Currently defined borders and multiple hypoechoic areas with surrounding  induration.  No results found.  PROCEDURES:  Critical Care performed: No  Ultrasound ED Soft Tissue  Date/Time: 11/23/2022 12:29 PM  Performed by: Conrad [place], PA-C Authorized by: Kern Reap A, PA-C   Procedure details:    Indications: localization of abscess   Location:    Location: buttocks     Side:  Right Findings:     abscess present    no foreign body present .Marland KitchenIncision and Drainage  Date/Time: 11/23/2022 12:31 PM  Performed by: Conrad Newell, PA-C Authorized by: Conrad [place], PA-C   Consent:    Consent obtained:  Verbal   Consent given by:  Patient   Risks, benefits, and alternatives were discussed: yes     Risks discussed:  Bleeding, incomplete drainage, pain and infection   Alternatives discussed:  Alternative treatment and observation Location:    Type:  Abscess   Size:  3x4   Location: perianal left. Pre-procedure details:    Skin preparation:  Povidone-iodine and chlorhexidine Anesthesia:    Anesthesia method:  Local infiltration  Local anesthetic:  Lidocaine 1% w/o epi Procedure type:    Complexity:  Simple Procedure details:    Ultrasound guidance: yes     Drainage:  Bloody and purulent   Drainage amount:  Scant Post-procedure details:    Procedure completion:  Procedure terminated at patient's request   MEDICATIONS ORDERED IN ED: Medications  lidocaine (PF) (XYLOCAINE) 1 % injection 5 mL (5 mLs Infiltration Given 11/23/22 1237)   IMPRESSION / MDM / ASSESSMENT AND PLAN / ED COURSE  I reviewed the triage vital signs and the nursing notes.                               27 y.o. female presents to the emergency department for evaluation and treatment of recurrent perianal abscess. See HPI for further details.   Differential diagnosis includes, but is not limited to perianal abscess, hemorrhoid, fistula.  Patient's presentation is most consistent with acute complicated illness / injury requiring diagnostic workup.  Patient is  awake and in no distress.  She is well-appearing.  She presents with a abscess with surrounding induration on right perianal area.  Ultrasound performed please see above procedure note.  Moderate amount of pus noted.  Incision and drainage indicated.  Incision and drainage procedure terminated at patient's request due to limited tolerance of lidocaine injection.  There was a scant amount of what and bloody material that was drained from abscess.  Patient did not want to continue procedure.  We will treat conservatively with antibiotics.  She is advised to perform frequent sitz bath's and follow-up with general surgery for further evaluation if abscess does not improve.  Patient will be discharged home in stable condition prescription for Keflex and Flagyl.  Patient is given ED precautions to return to the ED for any worsening or new symptoms. Patient verbalizes understanding. All questions and concerns were addressed during ED visit.     FINAL CLINICAL IMPRESSION(S) / ED DIAGNOSES   Final diagnoses:  Abscess   Rx / DC Orders   ED Discharge Orders          Ordered    cephALEXin (KEFLEX) 500 MG capsule  4 times daily        11/23/22 1304    metroNIDAZOLE (FLAGYL) 500 MG tablet  2 times daily,   Status:  Discontinued        11/23/22 1304    metroNIDAZOLE (FLAGYL) 500 MG tablet  2 times daily        11/23/22 1310             Note:  This document was prepared using Dragon voice recognition software and may include unintentional dictation errors.    Romeo Apple, [place] Ol[place] A, PA-C 11/23/22 1600    Sharyn Creamer, MD 11/24/22 1427

## 2022-11-23 NOTE — Discharge Instructions (Addendum)
You were seen today in the ED for an abscess in the gluteal region.  An ultrasound was performed and showed moderate amount of pus indicating incision and drainage.  Unfortunately the procedure was discontinue per your request due to tolerance of injection of numbing medicine.  We will conservatively treat with antibiotics.  Please keep area clean.  Sitz bath's twice a day.  Follow-up with general surgery for further evaluation if symptoms do not improve.

## 2023-01-25 IMAGING — CR DG ABDOMEN 1V
3 series · 3 of 3 positions shown · non-contrast
Comparison: CT abdomen pelvis 03/31/2016, x-ray abdomen 10/08/2017

CLINICAL DATA: Constipation

EXAM:
ABDOMEN - 1 VIEW

[abdomen kub (1 of 3)]
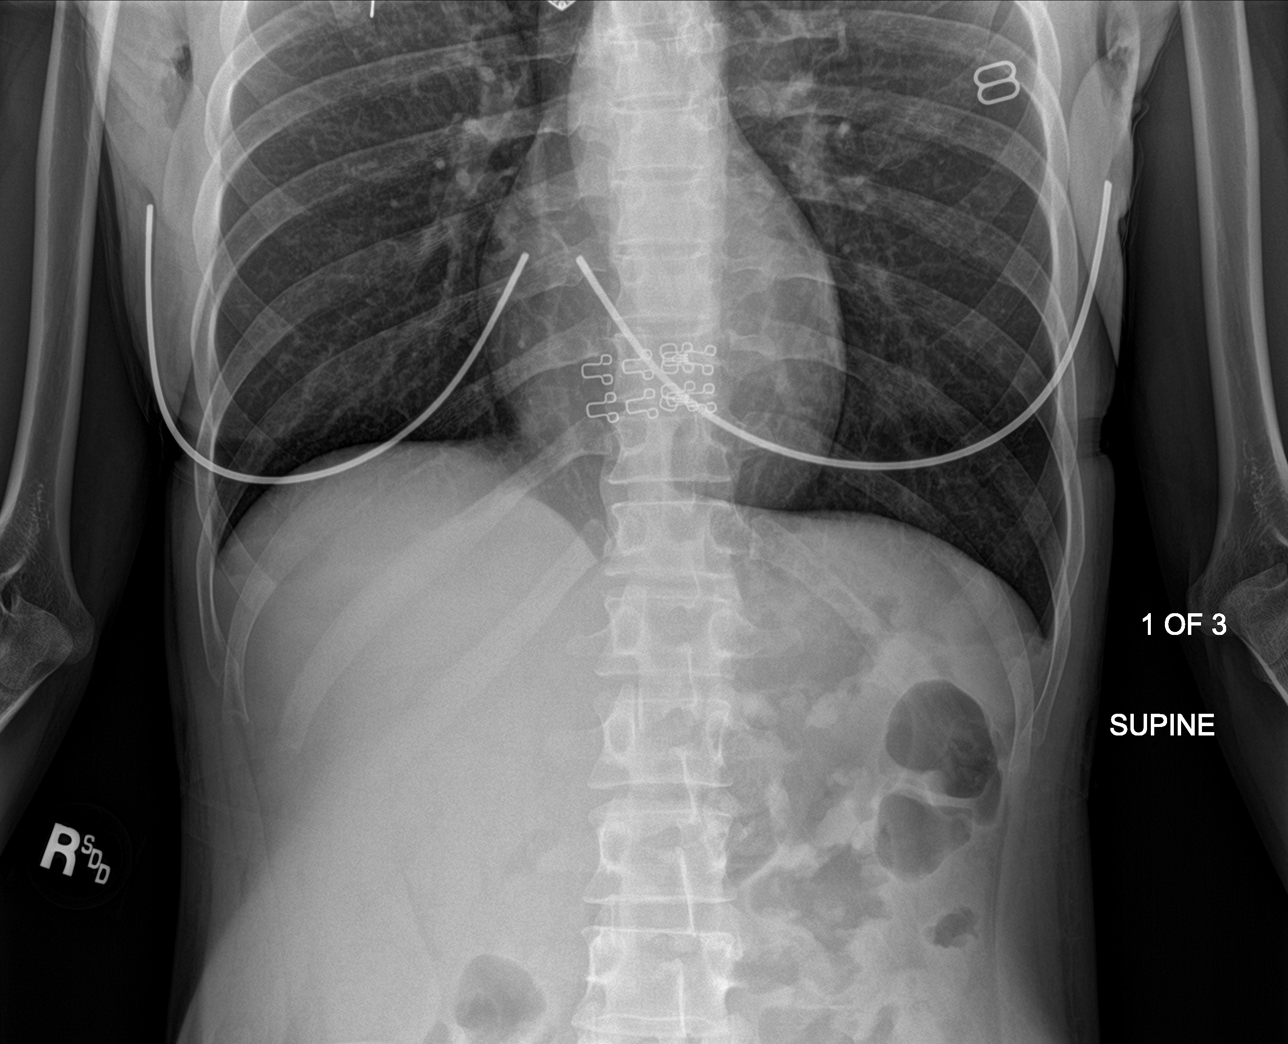

[abdomen kub (2 of 3)]
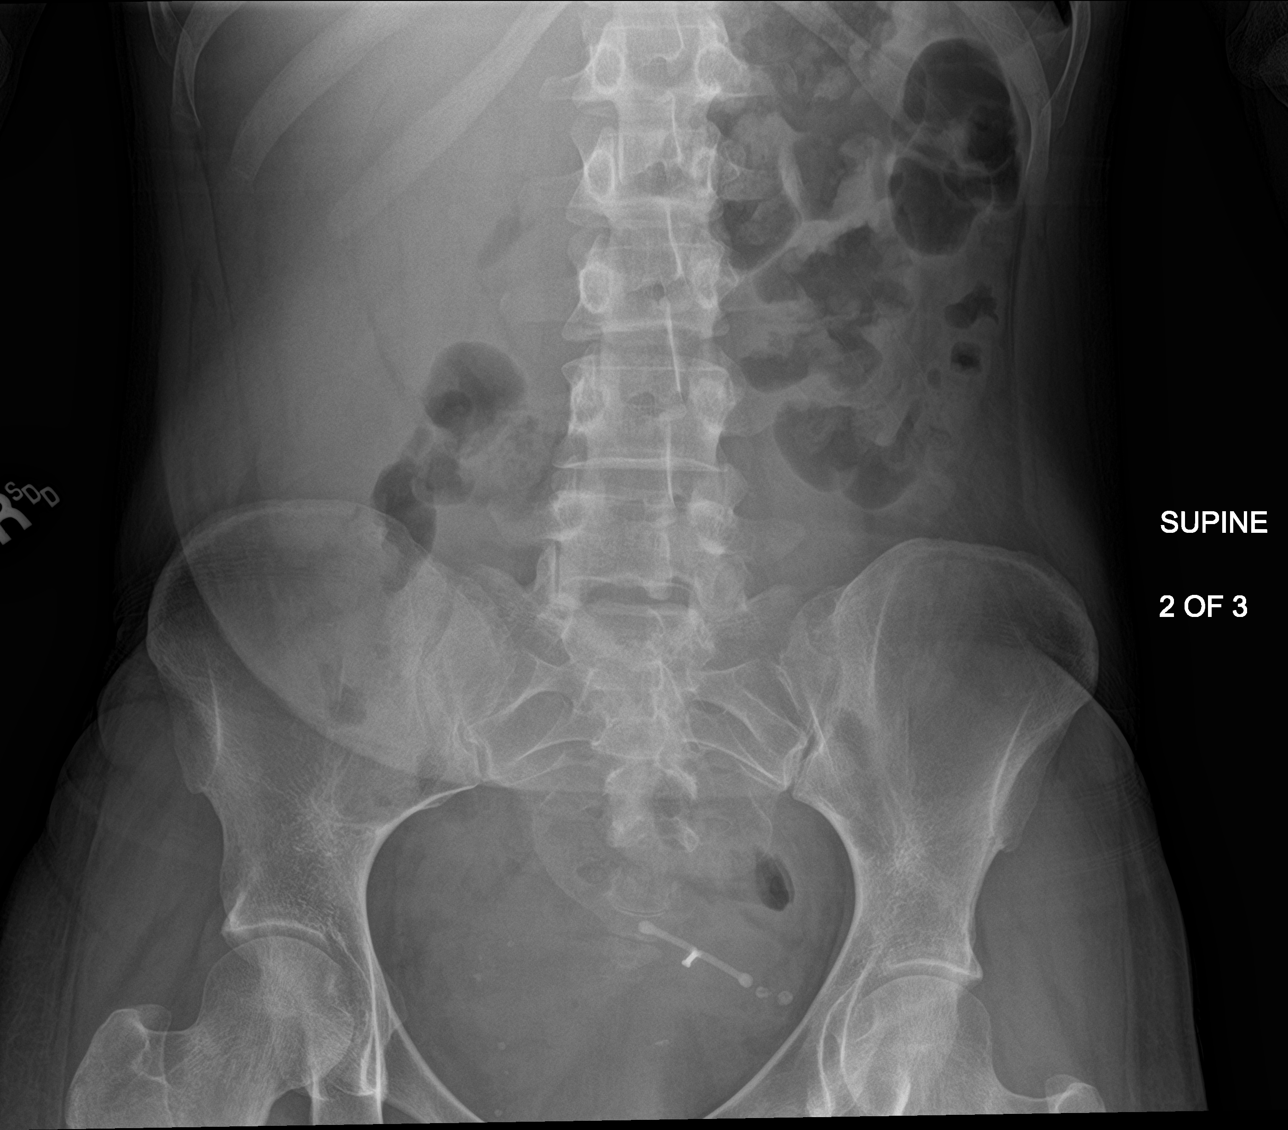

[abdomen kub (3 of 3)]
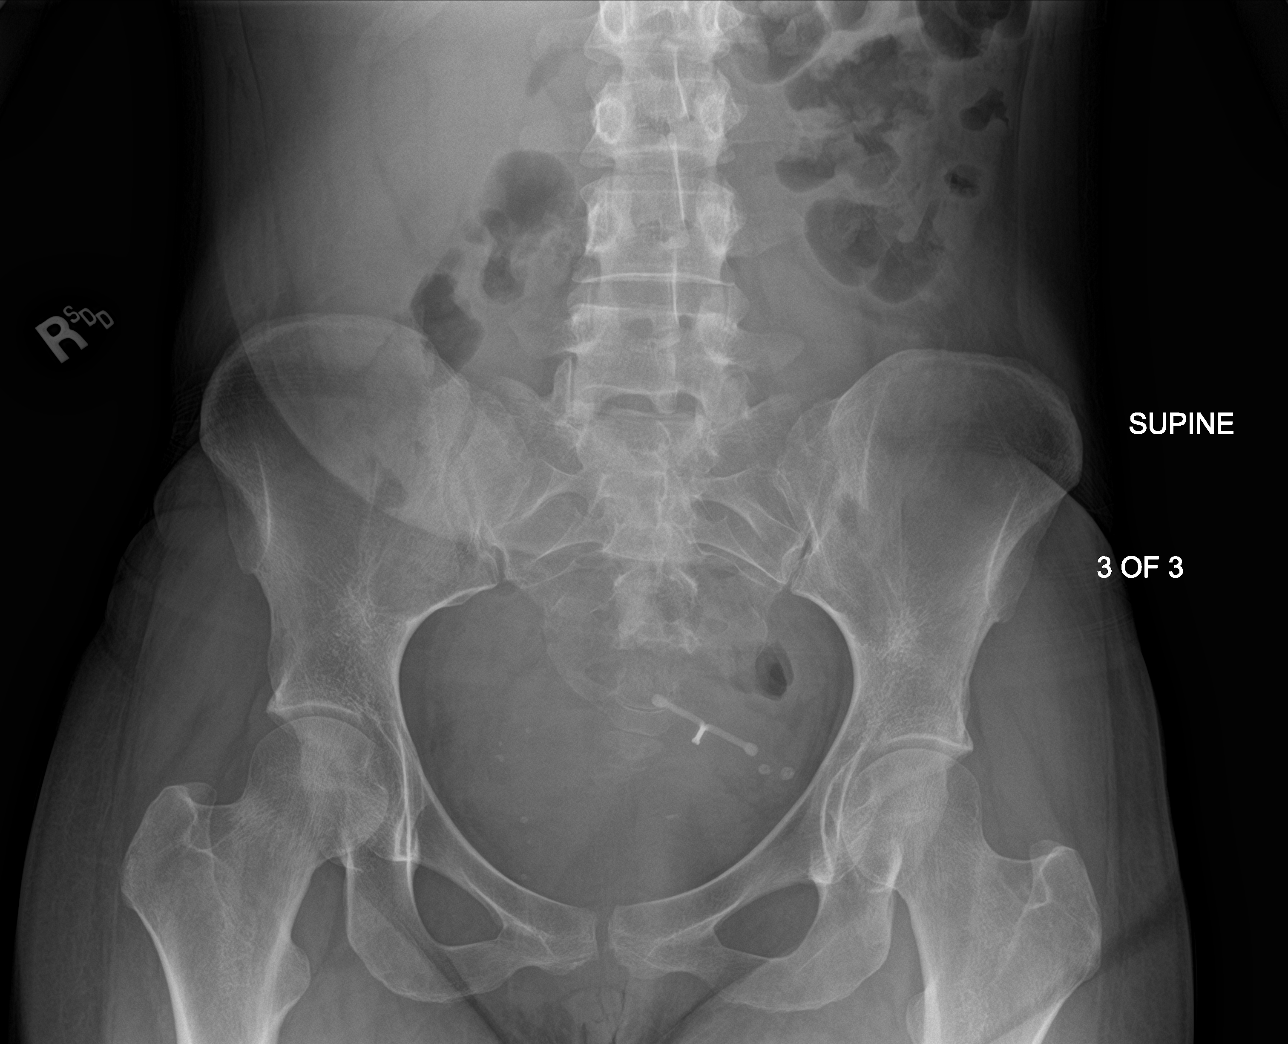

[3 of 3 positions shown; findings below may reference images not displayed]

FINDINGS: T-shaped intrauterine device in different orientation compared to
prior x-ray abdomen pelvis 10/08/2017. Exact positioning unclear on
radiograph. The bowel gas pattern is normal. Prominent liver shadow.
No radio-opaque calculi or other significant radiographic
abnormality are seen.
IMPRESSION: 1. T-shaped intrauterine device in different orientation compared to
prior x-ray abdomen pelvis 10/08/2017. Exact positioning unclear on
radiograph.
2. Nonobstructive bowel gas pattern.
3. Likely hepatomegaly.

## 2023-07-26 ENCOUNTER — Emergency Department
Admission: EM | Admit: 2023-07-26 | Discharge: 2023-07-26 | Disposition: A | Discharge status: Home / Self Care | Payer: Self-pay | Attending: Emergency Medicine | Admitting: Emergency Medicine

## 2023-07-26 ENCOUNTER — Other Ambulatory Visit: Payer: Self-pay

## 2023-07-26 DIAGNOSIS — N390 Urinary tract infection, site not specified: Secondary | ICD-10-CM | POA: Diagnosis not present

## 2023-07-26 DIAGNOSIS — I10 Essential (primary) hypertension: Secondary | ICD-10-CM | POA: Insufficient documentation

## 2023-07-26 DIAGNOSIS — R350 Frequency of micturition: Secondary | ICD-10-CM | POA: Diagnosis present

## 2023-07-26 LAB — URINALYSIS, ROUTINE W REFLEX MICROSCOPIC
Bilirubin Urine: NEGATIVE
Glucose, UA: NEGATIVE mg/dL
Ketones, ur: NEGATIVE mg/dL
Nitrite: NEGATIVE
Protein, ur: 100 mg/dL — AB
RBC / HPF: >50 RBC/hpf (ref 0–5)
Specific Gravity, Urine: 1.02 (ref 1.005–1.030)
WBC, UA: >50 WBC/hpf (ref 0–5)
pH: 7 (ref 5.0–8.0)

## 2023-07-26 LAB — POC URINE PREG, ED: Preg Test, Ur: NEGATIVE

## 2023-07-26 MED ORDER — CEPHALEXIN 500 MG PO CAPS: 500.0000 mg | ORAL_CAPSULE | Freq: Three times a day (TID) | ORAL | 0 refills | Status: DC

## 2023-07-26 NOTE — ED Triage Notes (Signed)
 Pt to ED for urinary frequency since today. States peeing very frequency and small amounts each time. Denies pain/burning. Pt in NAD.

## 2023-07-26 NOTE — Discharge Instructions (Signed)
 Follow-up with your regular doctor if improving 3 days.  Return if worsening.  Take medication as prescribed until it is all gone.
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
## 2023-07-29 LAB — URINE CULTURE: Culture: 50000 — AB

## 2023-09-12 ENCOUNTER — Ambulatory Visit (INDEPENDENT_AMBULATORY_CARE_PROVIDER_SITE_OTHER): Admitting: Nurse Practitioner

## 2023-09-12 ENCOUNTER — Encounter: Payer: Self-pay | Admitting: Nurse Practitioner

## 2023-09-12 VITALS — BP 110/78 | Temp 97.7°F | Resp 16 | Ht <= 58 in | Wt 126.4 lb

## 2023-09-12 DIAGNOSIS — E782 Mixed hyperlipidemia: Secondary | ICD-10-CM | POA: Diagnosis not present

## 2023-09-12 DIAGNOSIS — E538 Deficiency of other specified B group vitamins: Secondary | ICD-10-CM

## 2023-09-12 DIAGNOSIS — E611 Iron deficiency: Secondary | ICD-10-CM

## 2023-09-12 DIAGNOSIS — Z113 Encounter for screening for infections with a predominantly sexual mode of transmission: Secondary | ICD-10-CM

## 2023-09-12 DIAGNOSIS — B360 Pityriasis versicolor: Secondary | ICD-10-CM

## 2023-09-12 DIAGNOSIS — E559 Vitamin D deficiency, unspecified: Secondary | ICD-10-CM

## 2023-09-12 MED ORDER — SELENIUM SULFIDE 2.25 % EX SHAM: MEDICATED_SHAMPOO | CUTANEOUS | 0 refills | Status: AC

## 2023-09-12 MED ORDER — KETOCONAZOLE 2 % EX CREA: 1.0000 | TOPICAL_CREAM | Freq: Every day | CUTANEOUS | 0 refills | Status: AC

## 2023-09-12 NOTE — Progress Notes (Signed)
 [facility] [address] [place], [place] [address]  Internal MEDICINE  Office Visit Note  Patient Name: Dana Logan  [id number]  [id number]  Date of Service: 09/12/2023   Complaints/HPI Pt is here for establishment of PCP. Chief Complaint  Patient presents with   New Patient (Initial Visit)    Iron,night sweats, see stars in vision, white spots on face. Labs    HPI Dana Logan presents for a new patient visit to establish care.  Well-appearing 28 y.o. 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  Tobacco use: cigarettes less than 1 ppd, about 14 years off and on  Alcohol use: occasionally about every other weekend, tequila  Illicit drug use: smoke marijuana, daily  Pap smear: due now  Labs: due for routine labs  New or worsening pain: none  --wants labs checked --wants to be screened for STDs   Current Medication: Outpatient Encounter Medications as of 09/12/2023  Medication Sig   cephALEXin  (KEFLEX ) 500 MG capsule Take 1 capsule (500 mg total) by mouth 3 (three) times daily. (Patient not taking: Reported on 09/12/2023)   ketoconazole  (NIZORAL ) 2 % cream Apply 1 Application topically daily.   levonorgestrel (MIRENA) 20 MCG/24HR IUD 1 each by Intrauterine route once. (Patient not taking: Reported on 09/12/2023)   metoCLOPramide  (REGLAN ) 10 MG tablet Take 1 tablet (10 mg total) by mouth 3 (three) times daily with meals for 10 days.   metroNIDAZOLE  (FLAGYL ) 500 MG tablet Take 1 tablet (500 mg total) by mouth 2 (two) times daily. (Patient not taking: Reported on 09/12/2023)   Selenium  Sulfide 2.25 % SHAM Apply selenium  sulfide shampoo to face and neck. Let sit for ten minutes and then wash off fully.   [DISCONTINUED] Selenium  Sulfide 2.25 % SHAM Apply selenium  sulfide shampoo to face and neck. Let sit for ten minutes and  then wash off fully. (Patient not taking: Reported on 09/12/2023)   No facility-administered encounter medications on file as of 09/12/2023.    Surgical History: History reviewed. No pertinent surgical history.  Medical History: Past Medical History:  Diagnosis Date   GERD (gastroesophageal reflux disease)    Hypotension    Ovarian cyst     Family History: Family History  Problem Relation Age of Onset   Hypertension Mother    Diabetes Maternal Aunt    Hypertension Maternal Aunt    Asthma Maternal Grandmother    Diabetes Maternal Grandmother    Hearing loss Maternal Grandmother    Hypertension Maternal Grandmother    Asthma Paternal Grandmother    Hearing loss Paternal Grandmother     Social History   Socioeconomic History   Marital status: Single    Spouse name: Not on file   Number of children: Not on file   Years of education: Not on file   Highest education level: Not on file  Occupational History   Not on file  Tobacco Use   Smoking status: Every Day    Current packs/day: 0.00    Types: Cigarettes    Last attempt to quit: 10/14/2019    Years since quitting: 3.9   Smokeless tobacco: Never  Vaping Use   Vaping status: Never Used  Substance and Sexual Activity   Alcohol use: Yes    Alcohol/week: 2.0 standard drinks of alcohol    Types: 2 Shots of liquor per week  Comment: occ.   Drug use: Yes    Types: Marijuana    Comment: daily   Sexual activity: Yes  Other Topics Concern   Not on file  Social History Narrative   Not on file   Social Drivers of Health   Financial Resource Strain: Not on file  Food Insecurity: Not on file  Transportation Needs: Not on file  Physical Activity: Not on file  Stress: Not on file  Social Connections: Not on file  Intimate Partner Violence: Not on file     Review of Systems  Constitutional:  Positive for fatigue. Negative for chills and unexpected weight change.  HENT:  Negative for congestion, postnasal drip,  rhinorrhea, sneezing and sore throat.   Eyes:  Negative for redness.  Respiratory: Negative.  Negative for cough, chest tightness, shortness of breath and wheezing.   Cardiovascular: Negative.  Negative for chest pain and palpitations.  Gastrointestinal:  Negative for abdominal pain, constipation, diarrhea, nausea and vomiting.  Endocrine: Positive for cold intolerance.  Genitourinary: Negative.  Negative for dysuria and frequency.  Musculoskeletal: Negative.  Negative for arthralgias, back pain, joint swelling and neck pain.  Skin:  Negative for rash.  Neurological: Negative.  Negative for tremors and numbness.  Hematological:  Negative for adenopathy. Does not bruise/bleed easily.  Psychiatric/Behavioral:  Negative for behavioral problems (Depression), sleep disturbance and suicidal ideas. The patient is not nervous/anxious.     Vital Signs: BP 110/78   Temp 97.7 F (36.5 C)   Resp 16   Ht 4\' 9"  (1.448 m)   Wt 126 lb 6.4 oz (57.3 kg)   BMI 27.35 kg/m    Physical Exam Vitals reviewed.  Constitutional:      General: She is not in acute distress.    Appearance: Normal appearance. She is not ill-appearing.  HENT:     Head: Normocephalic and atraumatic.  Eyes:     Pupils: Pupils are equal, round, and reactive to light.  Cardiovascular:     Rate and Rhythm: Normal rate and regular rhythm.  Pulmonary:     Effort: Pulmonary effort is normal. No respiratory distress.  Skin:    General: Skin is warm and dry.     Capillary Refill: Capillary refill takes less than 2 seconds.  Neurological:     Mental Status: She is alert and oriented to person, place, and time.  Psychiatric:        Mood and Affect: Mood normal.        Behavior: Behavior normal.       Assessment/Plan: 1. Tinea versicolor (Primary) Topical treatment with ketoconazole  and selenium  sulfide as prescribed.  - Selenium  Sulfide 2.25 % SHAM; Apply selenium  sulfide shampoo to face and neck. Let sit for ten minutes  and then wash off fully.  Dispense: 180 mL; Refill: 0 - ketoconazole  (NIZORAL ) 2 % cream; Apply 1 Application topically daily.  Dispense: 30 g; Refill: 0  2. Iron deficiency Routine labs ordered  - CBC with Differential/Platelet - CMP14+EGFR - B12 and Folate Panel - Iron, TIBC and Ferritin Panel  3. Mixed hyperlipidemia Routine labs ordered  - CMP14+EGFR - Lipid Profile  4. B12 deficiency Routine labs ordered  - CBC with Differential/Platelet - B12 and Folate Panel - Iron, TIBC and Ferritin Panel  5. Vitamin D deficiency Routine labs ordered - CMP14+EGFR - Vitamin D (25 hydroxy)  6. Screening for STDs (sexually transmitted diseases) Blood and urine testing ordered.  - STI Profile - Chlamydia/Gonococcus/Trichomonas, NAA    General Counseling:  Dana Logan verbalizes understanding of the findings of todays visit and agrees with plan of treatment. I have discussed any further diagnostic evaluation that may be needed or ordered today. We also reviewed her medications today. she has been encouraged to call the office with any questions or concerns that should arise related to todays visit.    Orders Placed This Encounter  Procedures   Chlamydia/Gonococcus/Trichomonas, NAA   CBC with Differential/Platelet   CMP14+EGFR   Lipid Profile   B12 and Folate Panel   Vitamin D (25 hydroxy)   Iron, TIBC and Ferritin Panel   STI Profile    Meds ordered this encounter  Medications   Selenium  Sulfide 2.25 % SHAM    Sig: Apply selenium  sulfide shampoo to face and neck. Let sit for ten minutes and then wash off fully.    Dispense:  180 mL    Refill:  0   ketoconazole  (NIZORAL ) 2 % cream    Sig: Apply 1 Application topically daily.    Dispense:  30 g    Refill:  0    Return in about 2 weeks (around 09/26/2023) for CPE/PAP, Alee Katen PCP, Labs.  Time spent:30 Minutes Time spent with patient included reviewing progress notes, labs, imaging studies, and discussing plan for follow up.    [place] Controlled Substance Database was reviewed by me for overdose risk score (ORS)   This patient was seen by Laurence Pons, FNP-C in collaboration with Dr. Verneta Gone as a part of collaborative care agreement.   Dayvian Blixt R. Bobbi Burow, MSN, FNP-C Internal Medicine

## 2023-09-15 LAB — CHLAMYDIA/GONOCOCCUS/TRICHOMONAS, NAA
Chlamydia by NAA: NEGATIVE
Gonococcus by NAA: NEGATIVE
Trich vag by NAA: NEGATIVE

## 2023-09-19 ENCOUNTER — Telehealth: Payer: Self-pay | Admitting: Nurse Practitioner

## 2023-09-19 NOTE — Telephone Encounter (Signed)
 MB full, sent mychart message to confirm 09/26/23 appointment-Toni

## 2023-09-23 ENCOUNTER — Ambulatory Visit: Payer: Self-pay | Admitting: Nurse Practitioner

## 2023-09-23 NOTE — Progress Notes (Signed)
Urine was negative for STD's

## 2023-09-24 NOTE — Progress Notes (Signed)
 Try to call pt voice mail full

## 2023-09-24 NOTE — Telephone Encounter (Signed)
Attempt to call patient, voice mailbox is full

## 2023-09-26 ENCOUNTER — Other Ambulatory Visit: Admitting: Nurse Practitioner

## 2023-09-29 ENCOUNTER — Telehealth: Payer: Self-pay | Admitting: Nurse Practitioner

## 2023-09-29 NOTE — Telephone Encounter (Signed)
 MB full, sent message to reschedule 09/26/2023 missed appointment-Toni

## 2023-10-01 NOTE — Telephone Encounter (Signed)
-----   Message from Laurence Pons sent at 09/23/2023 10:27 PM EDT ----- Urine was negative for STDs.

## 2023-10-01 NOTE — Telephone Encounter (Signed)
 Patient notified

## 2023-10-22 ENCOUNTER — Other Ambulatory Visit: Admitting: Nurse Practitioner

## 2023-12-04 ENCOUNTER — Ambulatory Visit (INDEPENDENT_AMBULATORY_CARE_PROVIDER_SITE_OTHER): Admitting: Nurse Practitioner

## 2023-12-04 ENCOUNTER — Encounter: Payer: Self-pay | Admitting: Nurse Practitioner

## 2023-12-04 VITALS — BP 110/72 | Temp 97.8°F | Resp 16 | Ht <= 58 in | Wt 128.6 lb

## 2023-12-04 DIAGNOSIS — Z113 Encounter for screening for infections with a predominantly sexual mode of transmission: Secondary | ICD-10-CM

## 2023-12-04 DIAGNOSIS — E611 Iron deficiency: Secondary | ICD-10-CM

## 2023-12-04 DIAGNOSIS — Z30011 Encounter for initial prescription of contraceptive pills: Secondary | ICD-10-CM

## 2023-12-04 DIAGNOSIS — Z0001 Encounter for general adult medical examination with abnormal findings: Secondary | ICD-10-CM

## 2023-12-04 DIAGNOSIS — B360 Pityriasis versicolor: Secondary | ICD-10-CM

## 2023-12-04 DIAGNOSIS — E559 Vitamin D deficiency, unspecified: Secondary | ICD-10-CM

## 2023-12-04 LAB — POCT URINE PREGNANCY: Preg Test, Ur: NEGATIVE

## 2023-12-04 MED ORDER — NORGESTIM-ETH ESTRAD TRIPHASIC 0.18/0.215/0.25 MG-25 MCG PO TABS
1.0000 | ORAL_TABLET | Freq: Every day | ORAL | 3 refills | Status: AC
Start: 2023-12-04 — End: ?

## 2023-12-04 NOTE — Progress Notes (Signed)
 [facility] [address] [place], [place] [address]  Internal MEDICINE  Office Visit Note  Patient Name: Dana Logan  [id number]  [id number]  Date of Service: 12/04/2023  Chief Complaint  Patient presents with   Gastroesophageal Reflux   Annual Exam    HPI Dana Logan presents for an annual well visit and physical exam.  Well-appearing 28 y.o. female with no major medical problems  Pap smear: due now but will schedule later. Patient declined today Labs: due for labs, has orders already. Reminded patient to have her labs drawn New or worsening pain: none  Other concerns: none    Current Medication: Outpatient Encounter Medications as of 12/04/2023  Medication Sig   Norgestim-Eth Estrad Triphasic (NORGESTIMATE-ETHINYL ESTRADIOL TRIPHASIC) 0.18/0.215/0.25 MG-25 MCG tab Take 1 tablet by mouth daily.   ketoconazole  (NIZORAL ) 2 % cream Apply 1 Application topically daily.   metoCLOPramide  (REGLAN ) 10 MG tablet Take 1 tablet (10 mg total) by mouth 3 (three) times daily with meals for 10 days.   metroNIDAZOLE  (FLAGYL ) 500 MG tablet Take 1 tablet (500 mg total) by mouth 2 (two) times daily. (Patient not taking: Reported on 09/12/2023)   Selenium  Sulfide 2.25 % SHAM Apply selenium  sulfide shampoo to face and neck. Let sit for ten minutes and then wash off fully.   [DISCONTINUED] cephALEXin  (KEFLEX ) 500 MG capsule Take 1 capsule (500 mg total) by mouth 3 (three) times daily. (Patient not taking: Reported on 09/12/2023)   [DISCONTINUED] levonorgestrel (MIRENA) 20 MCG/24HR IUD 1 each by Intrauterine route once. (Patient not taking: Reported on 09/12/2023)   No facility-administered encounter medications on file as of 12/04/2023.    Surgical History: History reviewed. No pertinent surgical history.  Medical History: Past Medical History:  Diagnosis Date   GERD (gastroesophageal reflux disease)    Hypotension    Ovarian cyst     Family History: Family History  Problem  Relation Age of Onset   Hypertension Mother    Diabetes Maternal Aunt    Hypertension Maternal Aunt    Asthma Maternal Grandmother    Diabetes Maternal Grandmother    Hearing loss Maternal Grandmother    Hypertension Maternal Grandmother    Asthma Paternal Grandmother    Hearing loss Paternal Grandmother     Social History   Socioeconomic History   Marital status: Single    Spouse name: Not on file   Number of children: Not on file   Years of education: Not on file   Highest education level: Not on file  Occupational History   Not on file  Tobacco Use   Smoking status: Every Day    Current packs/day: 0.00    Types: Cigarettes    Last attempt to quit: 10/14/2019    Years since quitting: 4.1   Smokeless tobacco: Never  Vaping Use   Vaping status: Never Used  Substance and Sexual Activity   Alcohol use: Yes    Alcohol/week: 2.0 standard drinks of alcohol    Types: 2 Shots of liquor per week    Comment: occ.   Drug use: Yes    Types: Marijuana    Comment: daily   Sexual activity: Yes  Other Topics Concern   Not on file  Social History Narrative   Not on file   Social Drivers of Health   Financial Resource Strain: Not on file  Food Insecurity: Not on file  Transportation Needs: Not on file  Physical Activity: Not on file  Stress: Not on file  Social  Connections: Not on file  Intimate Partner Violence: Not on file      Review of Systems  Constitutional:  Positive for fatigue. Negative for activity change, appetite change, chills, fever and unexpected weight change.  HENT: Negative.  Negative for congestion, ear pain, postnasal drip, rhinorrhea, sneezing, sore throat and trouble swallowing.   Eyes: Negative.  Negative for redness.  Respiratory: Negative.  Negative for cough, chest tightness, shortness of breath and wheezing.   Cardiovascular: Negative.  Negative for chest pain and palpitations.  Gastrointestinal: Negative.  Negative for abdominal pain, blood in  stool, constipation, diarrhea, nausea and vomiting.  Endocrine: Positive for cold intolerance.  Genitourinary: Negative.  Negative for difficulty urinating, dysuria, frequency, hematuria and urgency.  Musculoskeletal: Negative.  Negative for arthralgias, back pain, joint swelling, myalgias and neck pain.  Skin: Negative.  Negative for rash and wound.  Allergic/Immunologic: Negative.  Negative for immunocompromised state.  Neurological: Negative.  Negative for dizziness, tremors, seizures, numbness and headaches.  Hematological: Negative.  Negative for adenopathy. Does not bruise/bleed easily.  Psychiatric/Behavioral: Negative.  Negative for behavioral problems (Depression), self-injury, sleep disturbance and suicidal ideas. The patient is not nervous/anxious.     Vital Signs: BP 110/72   Temp 97.8 F (36.6 C)   Resp 16   Ht 4' 9 (1.448 m)   Wt 128 lb 9.6 oz (58.3 kg)   BMI 27.83 kg/m    Physical Exam Vitals reviewed.  Constitutional:      General: She is not in acute distress.    Appearance: Normal appearance. She is well-developed and normal weight. She is not ill-appearing or diaphoretic.  HENT:     Head: Normocephalic and atraumatic.     Right Ear: Tympanic membrane, ear canal and external ear normal.     Left Ear: Tympanic membrane, ear canal and external ear normal.     Nose: Nose normal. No congestion or rhinorrhea.     Mouth/Throat:     Mouth: Mucous membranes are moist.     Pharynx: Oropharynx is clear. No oropharyngeal exudate or posterior oropharyngeal erythema.  Eyes:     General: No scleral icterus.       Right eye: No discharge.        Left eye: No discharge.     Extraocular Movements: Extraocular movements intact.     Conjunctiva/sclera: Conjunctivae normal.     Pupils: Pupils are equal, round, and reactive to light.  Neck:     Thyroid : No thyromegaly.     Vascular: No JVD.     Trachea: No tracheal deviation.  Cardiovascular:     Rate and Rhythm: Normal  rate and regular rhythm.     Pulses: Normal pulses.     Heart sounds: Normal heart sounds. No murmur heard.    No friction rub. No gallop.  Pulmonary:     Effort: Pulmonary effort is normal. No respiratory distress.     Breath sounds: Normal breath sounds. No stridor. No wheezing or rales.  Chest:     Chest wall: No tenderness.  Abdominal:     General: Bowel sounds are normal. There is no distension.     Palpations: Abdomen is soft. There is no mass.     Tenderness: There is no abdominal tenderness. There is no guarding or rebound.  Musculoskeletal:        General: No tenderness or deformity. Normal range of motion.     Cervical back: Normal range of motion and neck supple.  Lymphadenopathy:  Cervical: No cervical adenopathy.  Skin:    General: Skin is warm and dry.     Capillary Refill: Capillary refill takes less than 2 seconds.     Coloration: Skin is not pale.     Findings: No erythema or rash.  Neurological:     Mental Status: She is alert and oriented to person, place, and time.     Cranial Nerves: No cranial nerve deficit.     Motor: No abnormal muscle tone.     Coordination: Coordination normal.     Gait: Gait normal.     Deep Tendon Reflexes: Reflexes are normal and symmetric.  Psychiatric:        Mood and Affect: Mood normal.        Behavior: Behavior normal.        Thought Content: Thought content normal.        Judgment: Judgment normal.        Assessment/Plan: 1. Encounter for routine adult health examination with abnormal findings (Primary) Age-appropriate preventive screenings and vaccinations discussed, annual physical exam completed. Routine labs for health maintenance previously ordered, patient reminded to have labs done. Will schedule follow up for lab results and pap smear. PHM updated.    2. Tinea versicolor Continue using selenium  sulfide shampoo as directed on rash as needed.   3. Iron deficiency Reminded patient to have labs drawn   4.  Vitamin D deficiency Reminded patient to have labs drawn   5. Encounter for initial prescription of contraceptive pills Urine pregnancy test was negative. OCP prescribed, take as directed. - Norgestim-Eth Estrad Triphasic (NORGESTIMATE-ETHINYL ESTRADIOL TRIPHASIC) 0.18/0.215/0.25 MG-25 MCG tab; Take 1 tablet by mouth daily.  Dispense: 84 tablet; Refill: 3 - POCT urine pregnancy  6. Screening examination for sexually transmitted disease Urine sent for STD testing  - Chlamydia/Gonococcus/Trichomonas, NAA     General Counseling: Dana Logan verbalizes understanding of the findings of todays visit and agrees with plan of treatment. I have discussed any further diagnostic evaluation that may be needed or ordered today. We also reviewed her medications today. she has been encouraged to call the office with any questions or concerns that should arise related to todays visit.    Orders Placed This Encounter  Procedures   Chlamydia/Gonococcus/Trichomonas, NAA   POCT urine pregnancy    Meds ordered this encounter  Medications   Norgestim-Eth Estrad Triphasic (NORGESTIMATE-ETHINYL ESTRADIOL TRIPHASIC) 0.18/0.215/0.25 MG-25 MCG tab    Sig: Take 1 tablet by mouth daily.    Dispense:  84 tablet    Refill:  3    Fill new script today    Return in about 1 year (around 12/03/2024) for CPE, Levone Otten PCP and also need follow up in 1 month for pap and labs .   Total time spent:30 Minutes Time spent includes review of chart, medications, test results, and follow up plan with the patient.   [place] Controlled Substance Database was reviewed by me.  This patient was seen by Mardy Maxin, FNP-C in collaboration with Dr. Sigrid Bathe as a part of collaborative care agreement.  Dana Logan R. Maxin, MSN, FNP-C Internal medicine

## 2023-12-07 LAB — CHLAMYDIA/GONOCOCCUS/TRICHOMONAS, NAA
Chlamydia by NAA: NEGATIVE
Gonococcus by NAA: NEGATIVE
Trich vag by NAA: NEGATIVE

## 2023-12-08 ENCOUNTER — Ambulatory Visit: Payer: Self-pay | Admitting: Nurse Practitioner

## 2023-12-08 NOTE — Progress Notes (Signed)
 Urine negative for STDs.

## 2024-01-05 ENCOUNTER — Ambulatory Visit: Admitting: Nurse Practitioner

## 2024-01-08 ENCOUNTER — Ambulatory Visit: Admitting: Nurse Practitioner

## 2024-01-17 ENCOUNTER — Encounter: Payer: Self-pay | Admitting: Nurse Practitioner

## 2024-03-19 ENCOUNTER — Encounter: Payer: Self-pay | Admitting: Nurse Practitioner

## 2024-03-19 ENCOUNTER — Ambulatory Visit: Admitting: Nurse Practitioner

## 2024-03-19 VITALS — BP 110/70 | HR 80 | Temp 98.0°F | Resp 16 | Ht <= 58 in | Wt 128.0 lb

## 2024-03-19 DIAGNOSIS — Z309 Encounter for contraceptive management, unspecified: Secondary | ICD-10-CM

## 2024-03-19 DIAGNOSIS — Z3202 Encounter for pregnancy test, result negative: Secondary | ICD-10-CM

## 2024-03-19 DIAGNOSIS — L700 Acne vulgaris: Secondary | ICD-10-CM

## 2024-03-19 LAB — POCT URINE PREGNANCY: Preg Test, Ur: NEGATIVE

## 2024-03-19 NOTE — Progress Notes (Signed)
 [facility] [address] [place], [place] [address]  Internal MEDICINE  Office Visit Note  Patient Name: Dana Logan  [id number]  [id number]  Date of Service: 03/19/2024  Chief Complaint  Patient presents with   Acute Visit    Skin breakout and possible pregnancy      HPI Pt is here for a sick visit. -inconsistent with remembering birth control pills, had a mirena IUD previously and may want to go back on this vs consider patch. Would like referral to GYN for possible IUD -starting to have some more skin breakouts and may be related to inconsistent birth control pill use. She does not use anything but water on her face. No cleaners or anything as she always had clear skin previously  Current Medication:  Outpatient Encounter Medications as of 03/19/2024  Medication Sig   ketoconazole  (NIZORAL ) 2 % cream Apply 1 Application topically daily.   Norgestim-Eth Estrad Triphasic (NORGESTIMATE-ETHINYL ESTRADIOL TRIPHASIC) 0.18/0.215/0.25 MG-25 MCG tab Take 1 tablet by mouth daily.   Selenium  Sulfide 2.25 % SHAM Apply selenium  sulfide shampoo to face and neck. Let sit for ten minutes and then wash off fully.   [DISCONTINUED] metoCLOPramide  (REGLAN ) 10 MG tablet Take 1 tablet (10 mg total) by mouth 3 (three) times daily with meals for 10 days.   No facility-administered encounter medications on file as of 03/19/2024.      Medical History: Past Medical History:  Diagnosis Date   GERD (gastroesophageal reflux disease)    Hypotension    Ovarian cyst      Vital Signs: BP 110/70   Pulse 80   Temp 98 F (36.7 C)   Resp 16   Ht 4' 9 (1.448 m)   Wt 128 lb (58.1 kg)   SpO2 98%   BMI 27.70 kg/m    Review of Systems  Constitutional:  Negative for fatigue and fever.  HENT:  Negative for congestion, mouth sores and postnasal drip.   Respiratory:  Negative for cough.   Cardiovascular:  Negative for chest pain.  Genitourinary:  Negative for flank pain.  Skin:         Some skin breakouts on face  Psychiatric/Behavioral: Negative.      Physical Exam Vitals reviewed.  Constitutional:      General: She is not in acute distress.    Appearance: Normal appearance. She is not ill-appearing.  HENT:     Head: Normocephalic and atraumatic.  Eyes:     Extraocular Movements: Extraocular movements intact.  Cardiovascular:     Rate and Rhythm: Normal rate and regular rhythm.  Pulmonary:     Effort: Pulmonary effort is normal. No respiratory distress.  Skin:    General: Skin is warm and dry.  Neurological:     Mental Status: She is alert and oriented to person, place, and time.  Psychiatric:        Mood and Affect: Mood normal.        Behavior: Behavior normal.       Assessment/Plan: 1. Pregnancy examination or test, negative result (Primary) - POCT urine pregnancy negative and will restart pill, however given inconsistent use will refer for alternative contraceptive  2. Encounter for contraceptive management, unspecified type Inconsistent with birth control pills and wants to consider IUD again, will refer to GYN - Ambulatory referral to Obstetrics / Gynecology  3. Acne vulgaris Mild and may be related to inconsistent birth control use. Will start on gentle on-comedogenic cleanser for now while adjusting birth control  option   General Counseling: Lesia verbalizes understanding of the findings of todays visit and agrees with plan of treatment. I have discussed any further diagnostic evaluation that may be needed or ordered today. We also reviewed her medications today. she has been encouraged to call the office with any questions or concerns that should arise related to todays visit.    Counseling:    Orders Placed This Encounter  Procedures   Ambulatory referral to Obstetrics / Gynecology   POCT urine pregnancy    No orders of the defined types were placed in this encounter.   Time spent:30 Minutes

## 2024-12-06 ENCOUNTER — Encounter: Admitting: Nurse Practitioner

## 2024-12-09 ENCOUNTER — Encounter: Admitting: Nurse Practitioner
